# Patient Record
Sex: Female | Born: 1980 | Race: White | Hispanic: No | Marital: Single | Smoking: Current every day smoker
Health system: Southern US, Community
[De-identification: ages and names within clinical notes are randomized; demographics above are authoritative.]

## PROBLEM LIST (undated history)

## (undated) DIAGNOSIS — K759 Inflammatory liver disease, unspecified: Secondary | ICD-10-CM

## (undated) DIAGNOSIS — F1990 Other psychoactive substance use, unspecified, uncomplicated: Secondary | ICD-10-CM

## (undated) DIAGNOSIS — F329 Major depressive disorder, single episode, unspecified: Secondary | ICD-10-CM

## (undated) DIAGNOSIS — F1910 Other psychoactive substance abuse, uncomplicated: Secondary | ICD-10-CM

## (undated) DIAGNOSIS — F419 Anxiety disorder, unspecified: Secondary | ICD-10-CM

## (undated) DIAGNOSIS — F32A Depression, unspecified: Secondary | ICD-10-CM

## (undated) HISTORY — PX: TONSILLECTOMY: SUR1361

## (undated) HISTORY — PX: BACK SURGERY: SHX140

---

## 2013-06-21 ENCOUNTER — Emergency Department (HOSPITAL_COMMUNITY)
Admission: EM | Admit: 2013-06-21 | Discharge: 2013-06-22 | Disposition: A | Discharge status: Home / Self Care | Payer: Self-pay | Attending: Emergency Medicine | Admitting: Emergency Medicine

## 2013-06-21 ENCOUNTER — Encounter (HOSPITAL_COMMUNITY): Payer: Self-pay | Admitting: Emergency Medicine

## 2013-06-21 DIAGNOSIS — F1310 Sedative, hypnotic or anxiolytic abuse, uncomplicated: Secondary | ICD-10-CM | POA: Insufficient documentation

## 2013-06-21 DIAGNOSIS — F1910 Other psychoactive substance abuse, uncomplicated: Secondary | ICD-10-CM

## 2013-06-21 DIAGNOSIS — F1110 Opioid abuse, uncomplicated: Secondary | ICD-10-CM | POA: Insufficient documentation

## 2013-06-21 DIAGNOSIS — R45851 Suicidal ideations: Secondary | ICD-10-CM | POA: Insufficient documentation

## 2013-06-21 DIAGNOSIS — F3289 Other specified depressive episodes: Secondary | ICD-10-CM | POA: Insufficient documentation

## 2013-06-21 DIAGNOSIS — F1410 Cocaine abuse, uncomplicated: Secondary | ICD-10-CM | POA: Insufficient documentation

## 2013-06-21 DIAGNOSIS — F329 Major depressive disorder, single episode, unspecified: Secondary | ICD-10-CM | POA: Insufficient documentation

## 2013-06-21 DIAGNOSIS — Z3202 Encounter for pregnancy test, result negative: Secondary | ICD-10-CM | POA: Insufficient documentation

## 2013-06-21 LAB — CBC WITH DIFFERENTIAL/PLATELET
Basophils Relative: 1 % (ref 0–1)
Eosinophils Absolute: 0.3 10*3/uL (ref 0.0–0.7)
Eosinophils Relative: 3 % (ref 0–5)
HCT: 44 % (ref 36.0–46.0)
Lymphocytes Relative: 40 % (ref 12–46)
Lymphs Abs: 3.6 10*3/uL (ref 0.7–4.0)
MCH: 35.8 pg — ABNORMAL HIGH (ref 26.0–34.0)
MCHC: 36.1 g/dL — ABNORMAL HIGH (ref 30.0–36.0)
MCV: 99.1 fL (ref 78.0–100.0)
Monocytes Absolute: 0.7 10*3/uL (ref 0.1–1.0)
Monocytes Relative: 8 % (ref 3–12)
Neutrophils Relative %: 48 % (ref 43–77)
Platelets: 209 10*3/uL (ref 150–400)
WBC: 8.9 10*3/uL (ref 4.0–10.5)

## 2013-06-21 LAB — BASIC METABOLIC PANEL
BUN: 11 mg/dL (ref 6–23)
CO2: 28 mEq/L (ref 19–32)
Calcium: 9.5 mg/dL (ref 8.4–10.5)
Chloride: 99 mEq/L (ref 96–112)
GFR calc Af Amer: >90 mL/min (ref >90)
GFR calc non Af Amer: >90 mL/min (ref >90)
Glucose, Bld: 109 mg/dL — ABNORMAL HIGH (ref 70–99)
Potassium: 3.8 mEq/L (ref 3.5–5.1)
Sodium: 136 mEq/L (ref 135–145)

## 2013-06-21 LAB — ETHANOL: Alcohol, Ethyl (B): <11 mg/dL (ref 0–11)

## 2013-06-21 LAB — RAPID URINE DRUG SCREEN, HOSP PERFORMED
Barbiturates: POSITIVE — AB
Benzodiazepines: NOT DETECTED
Tetrahydrocannabinol: NOT DETECTED

## 2013-06-21 MED ORDER — NAPROXEN 500 MG PO TABS: 500.0000 mg | ORAL_TABLET | Freq: Two times a day (BID) | ORAL | Status: DC | PRN

## 2013-06-21 MED ORDER — METHOCARBAMOL 500 MG PO TABS: 500.0000 mg | ORAL_TABLET | Freq: Three times a day (TID) | ORAL | Status: DC | PRN

## 2013-06-21 MED ORDER — ONDANSETRON 4 MG PO TBDP: 4.0000 mg | ORAL_TABLET | Freq: Four times a day (QID) | ORAL | Status: DC | PRN

## 2013-06-21 MED ORDER — CLONIDINE HCL 0.1 MG PO TABS: 0.1000 mg | ORAL_TABLET | Freq: Every day | ORAL | Status: DC

## 2013-06-21 MED ORDER — HYDROXYZINE HCL 25 MG PO TABS
25.0000 mg | ORAL_TABLET | Freq: Four times a day (QID) | ORAL | Status: DC | PRN
  Administered 2013-06-22: 25 mg via ORAL
  Filled 2013-06-21: qty 1

## 2013-06-21 MED ORDER — LOPERAMIDE HCL 2 MG PO CAPS: 2.0000 mg | ORAL_CAPSULE | ORAL | Status: DC | PRN

## 2013-06-21 MED ORDER — CLONIDINE HCL 0.1 MG PO TABS: 0.1000 mg | ORAL_TABLET | ORAL | Status: DC

## 2013-06-21 MED ORDER — CLONIDINE HCL 0.1 MG PO TABS
0.1000 mg | ORAL_TABLET | Freq: Four times a day (QID) | ORAL | Status: DC
  Administered 2013-06-21 – 2013-06-22 (×4): 0.1 mg via ORAL
  Filled 2013-06-21 (×4): qty 1

## 2013-06-21 MED ORDER — DICYCLOMINE HCL 20 MG PO TABS: 20.0000 mg | ORAL_TABLET | Freq: Four times a day (QID) | ORAL | Status: DC | PRN

## 2013-06-21 NOTE — ED Notes (Addendum)
Pt has been wanded, belongings have been wanded and are at nurses station in triage. Attempted to call report. Nurses in report right now and are not available. Unable to give urine sample at this time as she states it is hard for her when she has been using heroin.

## 2013-06-21 NOTE — BH Assessment (Signed)
[facility] Assessment Progress Note    06/21/13.  Pt accepted to [facility].  Authorization obtained from [facility].  Auth # [id number] for 3 days from [place].  [facility] notified and they request that they be called with arrival time for pt, particularly if pt is taking the train. Daleen Squibb, LCSW

## 2013-06-21 NOTE — BH Assessment (Signed)
Assessment Note  Janice Simmons is an 32 y.o. female. Pt states she "wants to get off all the drugs I'm on."  Pt reports she is currently using heroin, morphine, opana, and cocaine.  Pt also reports she recently broke up with her girlfriend and has been depressed.  Pt is using heroin and morphine daily with last use this afternoon.  Pt denies current withdrawals but reports that she does experience significant withdrawals but they have not started currently.  Pt reports history of sweats, body aches, GI upset.  Pt reports that she has been depressed for several months and that recently, including earlier today, she has been having thoughts of "not wanting to be here anymore."  When questioned about this, pt has not had any suicidal plan or intent.  Pt knows that her substance abuse issues are the major issue in her life and pt does report she can contract for safety at this time.  Pt denies HI/AV.  Axis I: opioide dependence Axis II: Deferred Axis III: History reviewed. No pertinent past medical history. Axis IV: problems with primary support group Axis V: 41-50 serious symptoms  Past Medical History: History reviewed. No pertinent past medical history.  Past Surgical History  Procedure Laterality Date  . Tonsillectomy      Family History: No family history on file.  Social History:  reports that she uses illicit drugs (Cocaine and IV). She reports that she does not drink alcohol. Her tobacco history is not on file.  Additional Social History:  Alcohol / Drug Use Pain Medications: yes Prescriptions: yes History of alcohol / drug use?: Yes Negative Consequences of Use: Financial;Personal relationships;Work / Mining engineer #1 Name of Substance 1: heroin 1 - Age of First Use: 25 1 - Amount (size/oz): 1.5 grams 1 - Frequency: daily 1 - Duration: 3 years 1 - Last Use / Amount: 9/28 1 gram Substance #2 Name of Substance 2: morphine 2 - Age of First Use: 25 2 - Amount (size/oz):  350-400mg  2 - Frequency: daily 2 - Duration: 3 months 2 - Last Use / Amount: 9/28 120mg  Substance #3 Name of Substance 3: opana 3 - Age of First Use: 29 3 - Amount (size/oz): 40mg  3 - Frequency: 1-2x week 3 - Duration: 1 year 3 - Last Use / Amount: 9/24 40mg  Substance #4 Name of Substance 4: cocaine 4 - Age of First Use: 18 4 - Amount (size/oz): $20-40 4 - Frequency: 1x week 4 - Duration: 4 months 4 - Last Use / Amount: 9/23 $20  CIWA: CIWA-Ar BP: 103/64 mmHg Pulse Rate: 85 COWS: Clinical Opiate Withdrawal Scale (COWS) Resting Pulse Rate: Pulse Rate 81-100 Sweating: No report of chills or flushing Restlessness: Able to sit still Pupil Size: Pupils pinned or normal size for room light Bone or Joint Aches: Not present Runny Nose or Tearing: Not present GI Upset: No GI symptoms Tremor: No tremor Yawning: No yawning Anxiety or Irritability: None Gooseflesh Skin: Skin is smooth COWS Total Score: 1  Allergies: No Known Allergies  Home Medications:  (Not in a hospital admission)  OB/GYN Status:  Patient's last menstrual period was 05/29/2013.  General Assessment Data Location of Assessment: [facility] ACT Assessment: Yes Is this a Tele or Face-to-Face Assessment?: Face-to-Face Is this an Initial Assessment or a Re-assessment for this encounter?: Initial Assessment Living Arrangements: Parent Can pt return to current living arrangement?: Yes Admission Status: Voluntary Is patient capable of signing voluntary admission?: Yes Transfer from: Acute Hospital Referral Source: Self/Family/Friend  [facility] Crisis Care Plan Living Arrangements: Parent Name of Psychiatrist: none Name of Therapist: none     Risk to self Suicidal Ideation: Yes-Currently Present Suicidal Intent: No Is patient at risk for suicide?: No Suicidal Plan?: No Access to Means: No What has been your use of drugs/alcohol within the last 12 months?: current significant use Previous Attempts/Gestures:  No Intentional Self Injurious Behavior: None Family Suicide History: No Recent stressful life event(s): Conflict (Comment) (breakup with female partner) Persecutory voices/beliefs?: No Depression: Yes Depression Symptoms: Despondent;Insomnia;Tearfulness;Isolating;Fatigue;Guilt;Loss of interest in usual pleasures;Feeling worthless/self pity;Feeling angry/irritable Substance abuse history and/or treatment for substance abuse?: Yes Suicide prevention information given to non-admitted patients: Not applicable  Risk to Others Homicidal Ideation: No Thoughts of Harm to Others: No Current Homicidal Intent: No Current Homicidal Plan: No Access to Homicidal Means: No History of harm to others?: No Assessment of Violence: None Noted Does patient have access to weapons?: No Criminal Charges Pending?: No Does patient have a court date: No  Psychosis Hallucinations: None noted Delusions: None noted  Mental Status Report Appear/Hygiene: Other (Comment) (casual) Eye Contact: Good Motor Activity: Unremarkable Speech: Logical/coherent Level of Consciousness: Alert Mood: Depressed Affect: Appropriate to circumstance Anxiety Level: Minimal Thought Processes: Coherent;Relevant Judgement: Unimpaired Orientation: Person;Place;Time;Situation Obsessive Compulsive Thoughts/Behaviors: None  Cognitive Functioning Concentration: Normal Memory: Recent Intact;Remote Intact IQ: Average Insight: Good Impulse Control: Poor Appetite: Poor Weight Loss: 0 Weight Gain: 0 Sleep: Decreased Total Hours of Sleep: 3 Vegetative Symptoms: None  ADLScreening [facility] Assessment Services) Patient's cognitive ability adequate to safely complete daily activities?: No Patient able to express need for assistance with ADLs?: Yes Independently performs ADLs?: Yes (appropriate for developmental age)  Prior Inpatient Therapy Prior Inpatient Therapy: Yes Prior Therapy Dates: 2011 Prior Therapy Facilty/Provider(s):  [facility] Reason for Treatment: detox  Prior Outpatient Therapy Prior Outpatient Therapy: No  ADL Screening (condition at time of admission) Patient's cognitive ability adequate to safely complete daily activities?: No Patient able to express need for assistance with ADLs?: Yes Independently performs ADLs?: Yes (appropriate for developmental age)       Abuse/Neglect Assessment (Assessment to be complete while patient is alone) Physical Abuse: Denies Verbal Abuse: Denies Sexual Abuse: Denies Exploitation of patient/patient's resources: Denies Self-Neglect: Denies Values / Beliefs Cultural Requests During Hospitalization: None Spiritual Requests During Hospitalization: None   Advance Directives (For Healthcare) Advance Directive: Patient does not have advance directive;Patient would not like information    Additional Information 1:1 In Past 12 Months?: No CIRT Risk: No Elopement Risk: No Does patient have medical clearance?: Yes     Disposition:  Disposition Initial Assessment Completed for this Encounter: Yes  On Site Evaluation by:   Reviewed with Physician:    Lorri Frederick 06/21/2013 8:25 PM

## 2013-06-21 NOTE — ED Notes (Signed)
Pt states that she wants detox from opana,  Heroine, cocaine, and morphine. States she is depressed and suicidal w/o a plan. Has not been to detox before.

## 2013-06-21 NOTE — ED Provider Notes (Addendum)
CSN: [id number]     Arrival date & time 06/21/13  1722 History   First MD Initiated Contact with Patient 06/21/13 1734     Chief Complaint  Patient presents with  . Medical Clearance   (Consider location/radiation/quality/duration/timing/severity/associated sxs/prior Treatment) HPI Comments: Injects heroin, oxycodone, morphine, opana.  Last used this am.  Also uses cocaine but last use was earlier this week.  Patient is a 32 y.o. female presenting with drug problem. The history is provided by the patient.  Drug Problem This is a chronic problem. Episode onset: 3 years. The problem occurs constantly. The problem has not changed since onset.Associated symptoms comments: Depressed for the last few months and for the last few days pt had felt hopeless, that life is not worth living and passive SI. Nothing aggravates the symptoms. Nothing relieves the symptoms. She has tried nothing (has tried to quit at home by herself but did not succeed) for the symptoms. The treatment provided no relief.    History reviewed. No pertinent past medical history. Past Surgical History  Procedure Laterality Date  . Tonsillectomy     No family history on file. History  Substance Use Topics  . Smoking status: Not on file  . Smokeless tobacco: Not on file  . Alcohol Use: No   OB History   Grav Para Term Preterm Abortions TAB SAB Ect Mult Living                 Review of Systems  Psychiatric/Behavioral: Positive for suicidal ideas.  All other systems reviewed and are negative.    Allergies  Review of patient's allergies indicates no known allergies.  Home Medications  No current outpatient prescriptions on file. BP 119/65  Pulse 90  Temp(Src) 98.3 F (36.8 C) (Oral)  Resp 20  SpO2 98%  LMP 05/29/2013 Physical Exam  Nursing note and vitals reviewed. Constitutional: She is oriented to person, place, and time. She appears well-developed and well-nourished. No distress.  HENT:  Head:  Normocephalic and atraumatic.  Mouth/Throat: Oropharynx is clear and moist.  Eyes: Conjunctivae and EOM are normal. Pupils are equal, round, and reactive to light.  Neck: Normal range of motion. Neck supple.  Cardiovascular: Normal rate, regular rhythm and intact distal pulses.   No murmur heard. Pulmonary/Chest: Effort normal and breath sounds normal. No respiratory distress. She has no wheezes. She has no rales.  Abdominal: Soft. She exhibits no distension. There is no tenderness. There is no rebound and no guarding.  Musculoskeletal: Normal range of motion. She exhibits no edema and no tenderness.  Track marks but no signs of erythema or infection  Neurological: She is alert and oriented to person, place, and time.  Skin: Skin is warm and dry. No rash noted. No erythema.  Psychiatric: Her behavior is normal. She exhibits a depressed mood. She expresses suicidal ideation. She expresses no suicidal plans.    ED Course  Procedures (including critical care time) Labs Review Labs Reviewed - No data to display Imaging Review No results found.  MDM   1. Polysubstance abuse   2. Depression     Patient presenting with a history of narcotic and cocaine abuse here requesting detox but states that she has also had worsening depression and for the last 3-4 days started having past suicidal ideation. She has never attempted detox before and has tried just not using at home over the last few days and has failed because the withdrawal symptoms are too much for her to bear.  Patient has no prior medical history and has never been treated for depression. She has never attempted suicide in the suicidal ideations or new. Normal medical screening exam. Med clearance labs sent and patient placed on clonidine detox precautions. [facility] notified.    Gwyneth Sprout, MD 06/21/13 1754  Gwyneth Sprout, MD 06/21/13 1755

## 2013-06-22 DIAGNOSIS — F1910 Other psychoactive substance abuse, uncomplicated: Secondary | ICD-10-CM

## 2013-06-22 NOTE — Progress Notes (Signed)
Patient ID: Janice Simmons, female   DOB: 03-18-1981, 32 y.o.   MRN: [id number] ADDENDUM NOTE:  Patient decided she must leave, [facility] does not have any bed.  Patient is stable, states she does not think Clonidine is helping her.  She has not asked for medications for cramping or diarrhea.  Offered admission but she declined and wanted to leave.  Patient states her mother got her a bed at [facility]. Dr Lolly Mustache was consulted who says to offer admission to our inpatient unit but she declined.   Patient declined admission again and was discharged. Janice Simmons   PMHNP-BC I agreed with the findings, treatment and disposition plan of this patient.  Patient does not exhibit any suicidal thoughts or homicidal thoughts.  Offer outpatient referrals and inpatient psychiatric treatment but she declined. Kathryne Sharper, MD

## 2013-06-22 NOTE — BH Assessment (Signed)
Education officer, environmental for [employer]. To inquire re: getting pt placed there.   Evette Cristal, [place] Assessment Counselor

## 2013-06-22 NOTE — BH Assessment (Signed)
Per Alcario Drought at [facility], they have no [place] beds today.  Evette Cristal, [place] Assessment Counselor

## 2013-06-22 NOTE — ED Provider Notes (Signed)
Patient cleared for discharge by psychiatry. Patient has been given her instructions and followup resources by behavioral health.  Gilda Crease, MD 06/22/13 [phone]

## 2013-06-22 NOTE — Consult Note (Signed)
[facility] Face-to-Face Psychiatry Consult   Reason for Consult:  Opioid Dependence Referring Physician:  EDP Janice Simmons is an 32 y.o. female.  Assessment: AXIS I:  Substance Abuse AXIS II:  Deferred AXIS III:  History reviewed. No pertinent past medical history. AXIS IV:  other psychosocial or environmental problems and problems related to social environment AXIS V:  51-60 moderate symptoms  Plan:  Recommend psychiatric Inpatient admission when medically cleared.  Subjective:   Janice Simmons is a 32 y.o. female patient admitted with Opioid dependence.  HPI:  Patient is seeking detox from opioids; Heroin, Opana, Morphine and coacaine.   Patient also states she is depressed with suicidal thoughts but has no plan.  Patient reports using Morphine and Heroin daily since breaking up with her girl friend.  Patient denies previous detox treatment and stated she has had issues with drugs for a long time.  When asked why now, why she is seeking treatment she says because "my using is getting out of control"  We will seek a referral to out patient rehabilitation facility.  Meanwhile we will continue to use our clonidine protocol for her detoxification.  She denies SI/HI/AVH.  She is c/o body cramping and is medicated accordingly with Robaxin.  HPI Elements:   Location:  [facility]. Quality:  SEVERE.  Past Psychiatric History: History reviewed. No pertinent past medical history.  reports that she uses illicit drugs (Cocaine and IV). She reports that she does not drink alcohol. Her tobacco history is not on file. No family history on file. Family History Substance Abuse: Yes, Describe: (father) Family Supports: Yes, List: (girlfriend, family) Living Arrangements: Parent Can pt return to current living arrangement?: Yes Abuse/Neglect [facility]) Physical Abuse: Denies Verbal Abuse: Denies Sexual Abuse: Denies Allergies:  No Known Allergies  ACT Assessment Complete:  Yes:    Educational Status    Risk  to Self: Risk to self Suicidal Ideation: Yes-Currently Present Suicidal Intent: No Is patient at risk for suicide?: No Suicidal Plan?: No Access to Means: No What has been your use of drugs/alcohol within the last 12 months?: current significant use Previous Attempts/Gestures: No Intentional Self Injurious Behavior: None Family Suicide History: No Recent stressful life event(s): Conflict (Comment) (breakup with female partner) Persecutory voices/beliefs?: No Depression: Yes Depression Symptoms: Despondent;Insomnia;Tearfulness;Isolating;Fatigue;Guilt;Loss of interest in usual pleasures;Feeling worthless/self pity;Feeling angry/irritable Substance abuse history and/or treatment for substance abuse?: Yes Suicide prevention information given to non-admitted patients: Not applicable  Risk to Others: Risk to Others Homicidal Ideation: No Thoughts of Harm to Others: No Current Homicidal Intent: No Current Homicidal Plan: No Access to Homicidal Means: No History of harm to others?: No Assessment of Violence: None Noted Does patient have access to weapons?: No Criminal Charges Pending?: No Does patient have a court date: No  Abuse: Abuse/Neglect Assessment (Assessment to be complete while patient is alone) Physical Abuse: Denies Verbal Abuse: Denies Sexual Abuse: Denies Exploitation of patient/patient's resources: Denies Self-Neglect: Denies  Prior Inpatient Therapy: Prior Inpatient Therapy Prior Inpatient Therapy: Yes Prior Therapy Dates: 2011 Prior Therapy Facilty/Provider(s): [facility] Reason for Treatment: detox  Prior Outpatient Therapy: Prior Outpatient Therapy Prior Outpatient Therapy: No  Additional Information: Additional Information 1:1 In Past 12 Months?: No CIRT Risk: No Elopement Risk: No Does patient have medical clearance?: Yes                  Objective: Blood pressure 123/78, pulse 53, temperature 98.3 F (36.8 C), temperature source Oral,  resp. rate 18, last menstrual period 05/29/2013,  SpO2 95.00%.There is no height or weight on file to calculate BMI. Results for orders placed during the hospital encounter of 06/21/13 (from the past 72 hour(s))  CBC WITH DIFFERENTIAL     Status: Abnormal   Collection Time    06/21/13  7:21 PM      Result Value Range   WBC 8.9  4.0 - 10.5 K/uL   RBC 4.44  3.87 - 5.11 MIL/uL   Hemoglobin 15.9 (*) 12.0 - 15.0 g/dL   HCT 16.1  09.6 - 04.5 %   MCV 99.1  78.0 - 100.0 fL   MCH 35.8 (*) 26.0 - 34.0 pg   MCHC 36.1 (*) 30.0 - 36.0 g/dL   RDW 40.9  81.1 - 91.4 %   Platelets 209  150 - 400 K/uL   Neutrophils Relative % 48  43 - 77 %   Neutro Abs 4.3  1.7 - 7.7 K/uL   Lymphocytes Relative 40  12 - 46 %   Lymphs Abs 3.6  0.7 - 4.0 K/uL   Monocytes Relative 8  3 - 12 %   Monocytes Absolute 0.7  0.1 - 1.0 K/uL   Eosinophils Relative 3  0 - 5 %   Eosinophils Absolute 0.3  0.0 - 0.7 K/uL   Basophils Relative 1  0 - 1 %   Basophils Absolute 0.1  0.0 - 0.1 K/uL  BASIC METABOLIC PANEL     Status: Abnormal   Collection Time    06/21/13  7:21 PM      Result Value Range   Sodium 136  135 - 145 mEq/L   Potassium 3.8  3.5 - 5.1 mEq/L   Chloride 99  96 - 112 mEq/L   CO2 28  19 - 32 mEq/L   Glucose, Bld 109 (*) 70 - 99 mg/dL   BUN 11  6 - 23 mg/dL   Creatinine, Ser 7.82  0.50 - 1.10 mg/dL   Calcium 9.5  8.4 - 95.6 mg/dL   GFR calc non Af Amer >90  >90 mL/min   GFR calc Af Amer >90  >90 mL/min   Comment: (NOTE)     The eGFR has been calculated using the CKD EPI equation.     This calculation has not been validated in all clinical situations.     eGFR's persistently <90 mL/min signify possible Chronic Kidney     Disease.  ETHANOL     Status: None   Collection Time    06/21/13  7:21 PM      Result Value Range   Alcohol, Ethyl (B) <11  0 - 11 mg/dL   Comment:            LOWEST DETECTABLE LIMIT FOR     SERUM ALCOHOL IS 11 mg/dL     FOR MEDICAL PURPOSES ONLY  URINE RAPID DRUG SCREEN (HOSP  PERFORMED)     Status: Abnormal   Collection Time    06/21/13  8:11 PM      Result Value Range   Opiates POSITIVE (*) NONE DETECTED   Cocaine NONE DETECTED  NONE DETECTED   Benzodiazepines NONE DETECTED  NONE DETECTED   Amphetamines NONE DETECTED  NONE DETECTED   Tetrahydrocannabinol NONE DETECTED  NONE DETECTED   Barbiturates POSITIVE (*) NONE DETECTED   Comment:            DRUG SCREEN FOR MEDICAL PURPOSES     ONLY.  IF CONFIRMATION IS NEEDED     FOR ANY PURPOSE, NOTIFY  LAB     WITHIN 5 DAYS.                LOWEST DETECTABLE LIMITS     FOR URINE DRUG SCREEN     Drug Class       Cutoff (ng/mL)     Amphetamine      1000     Barbiturate      200     Benzodiazepine   200     Tricyclics       300     Opiates          300     Cocaine          300     THC              50  POCT PREGNANCY, URINE     Status: None   Collection Time    06/21/13  8:21 PM      Result Value Range   Preg Test, Ur NEGATIVE  NEGATIVE   Comment:            THE SENSITIVITY OF THIS     METHODOLOGY IS >24 mIU/mL   Labs are reviewed and are pertinent for POSITIVE for Opiates and Barbiturates, elevated MCH and MCV.  Current Facility-Administered Medications  Medication Dose Route Frequency Provider Last Rate Last Dose  . cloNIDine (CATAPRES) tablet 0.1 mg  0.1 mg Oral QID Gwyneth Sprout, MD   0.1 mg at 06/22/13 0946   Followed by  . [START ON 06/24/2013] cloNIDine (CATAPRES) tablet 0.1 mg  0.1 mg Oral BH-qamhs Gwyneth Sprout, MD       Followed by  . [START ON 06/26/2013] cloNIDine (CATAPRES) tablet 0.1 mg  0.1 mg Oral QAC breakfast Gwyneth Sprout, MD      . dicyclomine (BENTYL) tablet 20 mg  20 mg Oral Q6H PRN Gwyneth Sprout, MD      . hydrOXYzine (ATARAX/VISTARIL) tablet 25 mg  25 mg Oral Q6H PRN Gwyneth Sprout, MD   25 mg at 06/22/13 0033  . loperamide (IMODIUM) capsule 2-4 mg  2-4 mg Oral PRN Gwyneth Sprout, MD      . methocarbamol (ROBAXIN) tablet 500 mg  500 mg Oral Q8H PRN Gwyneth Sprout, MD       . naproxen (NAPROSYN) tablet 500 mg  500 mg Oral BID PRN Gwyneth Sprout, MD      . ondansetron (ZOFRAN-ODT) disintegrating tablet 4 mg  4 mg Oral Q6H PRN Gwyneth Sprout, MD       No current outpatient prescriptions on file.    Psychiatric Specialty Exam:     Blood pressure 123/78, pulse 53, temperature 98.3 F (36.8 C), temperature source Oral, resp. rate 18, last menstrual period 05/29/2013, SpO2 95.00%.There is no height or weight on file to calculate BMI.  General Appearance: Casual  Eye Contact::  Fair  Speech:  Clear and Coherent and Normal Rate  Volume:  Normal  Mood:  Anxious, Depressed and Irritable  Affect:  Congruent  Thought Process:  Coherent  Orientation:  Full (Time, Place, and Person)  Thought Content:  NA  Suicidal Thoughts:  No  Homicidal Thoughts:  No  Memory:  Immediate;   Good Recent;   Good Remote;   Good  Judgement:  Poor  Insight:  Fair  Psychomotor Activity:  Restlessness  Concentration:  Fair  Recall:  NA  Akathisia:  NA  Handed:  Right  AIMS (if indicated):     Assets:  Desire for  Improvement  Sleep:      Treatment Plan Summary:  Consult and face to face interview with Arfeen We will continue to use our Clonidine protocol for her detox We will seek referral to another facility that has available bed for detox.  Daily contact with patient to assess and evaluate symptoms and progress in treatment Medication management  Earney Navy  PMHNP-BC 06/22/2013 12:06 PM  I have personally seen the patient and agreed with the findings and involved in the treatment plan. Kathryne Sharper, MD

## 2013-06-22 NOTE — Progress Notes (Signed)
[facility] provided patient with a [facility] Orange Card application, highlighting [facility].  °

## 2013-06-22 NOTE — Progress Notes (Signed)
Patient requesting to go home, CSW provided pt with outpatient resources. CSW informed rn and EDP.   Marland KitchenFrutoso Schatz [phone]  ED CSW 06/22/2013 1502pm

## 2013-06-22 NOTE — Progress Notes (Signed)
CSW met with pt regarding transportation. Patient states that she does not have a ride to [facility], and does not have a valid ID. Due to patient not having a valid ID, patient can not be transported to [facility] by train. CSW called [facility] to refer patient for detox, however will have to call bed around 9:15 to discuss admissions once availability is determined.   Catha Gosselin, LCSW [phone]  ED CSW .06/22/2013 8:21am

## 2013-11-12 ENCOUNTER — Emergency Department (HOSPITAL_COMMUNITY)
Admission: EM | Admit: 2013-11-12 | Discharge: 2013-11-12 | Disposition: A | Discharge status: Other Acute Inpatient Hospital | Payer: Self-pay | Attending: Emergency Medicine | Admitting: Emergency Medicine

## 2013-11-12 ENCOUNTER — Inpatient Hospital Stay (HOSPITAL_COMMUNITY)
Admission: AD | Admit: 2013-11-12 | Discharge: 2013-11-13 | DRG: 897 | Disposition: A | Discharge status: Home / Self Care | Payer: Federal, State, Local not specified - Other | Source: Intra-hospital | Attending: Psychiatry | Admitting: Psychiatry

## 2013-11-12 ENCOUNTER — Encounter (HOSPITAL_COMMUNITY): Payer: Self-pay | Admitting: Emergency Medicine

## 2013-11-12 ENCOUNTER — Encounter (HOSPITAL_COMMUNITY): Payer: Self-pay | Admitting: *Deleted

## 2013-11-12 DIAGNOSIS — F1910 Other psychoactive substance abuse, uncomplicated: Secondary | ICD-10-CM

## 2013-11-12 DIAGNOSIS — F1920 Other psychoactive substance dependence, uncomplicated: Principal | ICD-10-CM | POA: Diagnosis present

## 2013-11-12 DIAGNOSIS — F1410 Cocaine abuse, uncomplicated: Secondary | ICD-10-CM | POA: Diagnosis present

## 2013-11-12 DIAGNOSIS — Z8619 Personal history of other infectious and parasitic diseases: Secondary | ICD-10-CM | POA: Insufficient documentation

## 2013-11-12 DIAGNOSIS — F3289 Other specified depressive episodes: Secondary | ICD-10-CM | POA: Insufficient documentation

## 2013-11-12 DIAGNOSIS — F329 Major depressive disorder, single episode, unspecified: Secondary | ICD-10-CM | POA: Insufficient documentation

## 2013-11-12 DIAGNOSIS — F1020 Alcohol dependence, uncomplicated: Secondary | ICD-10-CM | POA: Insufficient documentation

## 2013-11-12 DIAGNOSIS — F1110 Opioid abuse, uncomplicated: Secondary | ICD-10-CM | POA: Insufficient documentation

## 2013-11-12 DIAGNOSIS — F1510 Other stimulant abuse, uncomplicated: Secondary | ICD-10-CM | POA: Insufficient documentation

## 2013-11-12 DIAGNOSIS — R300 Dysuria: Secondary | ICD-10-CM | POA: Insufficient documentation

## 2013-11-12 DIAGNOSIS — F17200 Nicotine dependence, unspecified, uncomplicated: Secondary | ICD-10-CM | POA: Insufficient documentation

## 2013-11-12 DIAGNOSIS — G479 Sleep disorder, unspecified: Secondary | ICD-10-CM | POA: Insufficient documentation

## 2013-11-12 DIAGNOSIS — F411 Generalized anxiety disorder: Secondary | ICD-10-CM | POA: Insufficient documentation

## 2013-11-12 HISTORY — DX: Major depressive disorder, single episode, unspecified: F32.9

## 2013-11-12 HISTORY — DX: Depression, unspecified: F32.A

## 2013-11-12 HISTORY — DX: Anxiety disorder, unspecified: F41.9

## 2013-11-12 LAB — COMPREHENSIVE METABOLIC PANEL
ALBUMIN: 3.4 g/dL — AB (ref 3.5–5.2)
ALT: 21 U/L (ref 0–35)
AST: 20 U/L (ref 0–37)
Alkaline Phosphatase: 120 U/L — ABNORMAL HIGH (ref 39–117)
BUN: 10 mg/dL (ref 6–23)
CHLORIDE: 101 meq/L (ref 96–112)
CO2: 29 mEq/L (ref 19–32)
CREATININE: 0.63 mg/dL (ref 0.50–1.10)
Calcium: 9.1 mg/dL (ref 8.4–10.5)
GFR calc Af Amer: >90 mL/min (ref >90)
GFR calc non Af Amer: >90 mL/min (ref >90)
Glucose, Bld: 103 mg/dL — ABNORMAL HIGH (ref 70–99)
Potassium: 3.9 mEq/L (ref 3.7–5.3)
Sodium: 142 mEq/L (ref 137–147)
Total Bilirubin: 0.7 mg/dL (ref 0.3–1.2)
Total Protein: 7.4 g/dL (ref 6.0–8.3)

## 2013-11-12 LAB — URINALYSIS, ROUTINE W REFLEX MICROSCOPIC
BILIRUBIN URINE: NEGATIVE
Glucose, UA: NEGATIVE mg/dL
Hgb urine dipstick: NEGATIVE
KETONES UR: NEGATIVE mg/dL
NITRITE: NEGATIVE
PH: 6 (ref 5.0–8.0)
Protein, ur: NEGATIVE mg/dL
Specific Gravity, Urine: 1.016 (ref 1.005–1.030)
Urobilinogen, UA: 0.2 mg/dL (ref 0.0–1.0)

## 2013-11-12 LAB — RAPID URINE DRUG SCREEN, HOSP PERFORMED
Amphetamines: NOT DETECTED
BARBITURATES: NOT DETECTED
BENZODIAZEPINES: POSITIVE — AB
Cocaine: NOT DETECTED
Opiates: POSITIVE — AB
Tetrahydrocannabinol: NOT DETECTED

## 2013-11-12 LAB — CBC WITH DIFFERENTIAL/PLATELET
BASOS ABS: 0 10*3/uL (ref 0.0–0.1)
BASOS PCT: 0 % (ref 0–1)
EOS PCT: 5 % (ref 0–5)
Eosinophils Absolute: 0.4 10*3/uL (ref 0.0–0.7)
HCT: 48.1 % — ABNORMAL HIGH (ref 36.0–46.0)
Hemoglobin: 16.5 g/dL — ABNORMAL HIGH (ref 12.0–15.0)
Lymphocytes Relative: 44 % (ref 12–46)
Lymphs Abs: 3.7 10*3/uL (ref 0.7–4.0)
MCH: 31.6 pg (ref 26.0–34.0)
MCHC: 34.3 g/dL (ref 30.0–36.0)
MCV: 92.1 fL (ref 78.0–100.0)
Monocytes Absolute: 0.6 10*3/uL (ref 0.1–1.0)
Monocytes Relative: 8 % (ref 3–12)
Neutro Abs: 3.6 10*3/uL (ref 1.7–7.7)
Neutrophils Relative %: 43 % (ref 43–77)
PLATELETS: 219 10*3/uL (ref 150–400)
RBC: 5.22 MIL/uL — ABNORMAL HIGH (ref 3.87–5.11)
RDW: 14.1 % (ref 11.5–15.5)
WBC: 8.3 10*3/uL (ref 4.0–10.5)

## 2013-11-12 LAB — URINE MICROSCOPIC-ADD ON

## 2013-11-12 LAB — SALICYLATE LEVEL

## 2013-11-12 LAB — ACETAMINOPHEN LEVEL

## 2013-11-12 LAB — ETHANOL: Alcohol, Ethyl (B): <11 mg/dL (ref 0–11)

## 2013-11-12 MED ORDER — ALUM & MAG HYDROXIDE-SIMETH 200-200-20 MG/5ML PO SUSP
30.0000 mL | ORAL | Status: DC | PRN
Start: 2013-11-12 — End: 2013-11-14

## 2013-11-12 MED ORDER — ONDANSETRON HCL 4 MG PO TABS: 4.0000 mg | ORAL_TABLET | Freq: Three times a day (TID) | ORAL | Status: DC | PRN

## 2013-11-12 MED ORDER — VITAMIN B-1 100 MG PO TABS
100.0000 mg | ORAL_TABLET | Freq: Every day | ORAL | Status: DC
  Administered 2013-11-13: 100 mg via ORAL
  Filled 2013-11-12 (×3): qty 1

## 2013-11-12 MED ORDER — ALUM & MAG HYDROXIDE-SIMETH 200-200-20 MG/5ML PO SUSP
30.0000 mL | ORAL | Status: DC | PRN
Start: 2013-11-12 — End: 2013-11-12

## 2013-11-12 MED ORDER — LORAZEPAM 1 MG PO TABS
1.0000 mg | ORAL_TABLET | Freq: Once | ORAL | Status: AC
  Administered 2013-11-12: 1 mg via ORAL
  Filled 2013-11-12: qty 1

## 2013-11-12 MED ORDER — ACETAMINOPHEN 325 MG PO TABS: 650.0000 mg | ORAL_TABLET | Freq: Four times a day (QID) | ORAL | Status: DC | PRN

## 2013-11-12 MED ORDER — CHLORDIAZEPOXIDE HCL 25 MG PO CAPS: 50.0000 mg | ORAL_CAPSULE | Freq: Every day | ORAL | Status: DC

## 2013-11-12 MED ORDER — CHLORDIAZEPOXIDE HCL 25 MG PO CAPS: 25.0000 mg | ORAL_CAPSULE | Freq: Every day | ORAL | Status: DC

## 2013-11-12 MED ORDER — SODIUM CHLORIDE 0.9 % IV BOLUS (SEPSIS)
1000.0000 mL | Freq: Once | INTRAVENOUS | Status: AC
  Administered 2013-11-12: 1000 mL via INTRAVENOUS

## 2013-11-12 MED ORDER — ZOLPIDEM TARTRATE 5 MG PO TABS: 5.0000 mg | ORAL_TABLET | Freq: Every evening | ORAL | Status: DC | PRN

## 2013-11-12 MED ORDER — CHLORDIAZEPOXIDE HCL 25 MG PO CAPS: 25.0000 mg | ORAL_CAPSULE | Freq: Three times a day (TID) | ORAL | Status: DC

## 2013-11-12 MED ORDER — CHLORDIAZEPOXIDE HCL 25 MG PO CAPS
25.0000 mg | ORAL_CAPSULE | Freq: Four times a day (QID) | ORAL | Status: DC
  Filled 2013-11-12: qty 1

## 2013-11-12 MED ORDER — ONDANSETRON 4 MG PO TBDP
4.0000 mg | ORAL_TABLET | Freq: Once | ORAL | Status: AC
  Administered 2013-11-12: 4 mg via ORAL
  Filled 2013-11-12: qty 1

## 2013-11-12 MED ORDER — CHLORDIAZEPOXIDE HCL 25 MG PO CAPS: 25.0000 mg | ORAL_CAPSULE | Freq: Four times a day (QID) | ORAL | Status: DC

## 2013-11-12 MED ORDER — IBUPROFEN 200 MG PO TABS
600.0000 mg | ORAL_TABLET | Freq: Once | ORAL | Status: AC
  Administered 2013-11-12: 600 mg via ORAL
  Filled 2013-11-12: qty 3

## 2013-11-12 MED ORDER — CHLORDIAZEPOXIDE HCL 25 MG PO CAPS
25.0000 mg | ORAL_CAPSULE | Freq: Four times a day (QID) | ORAL | Status: DC
  Administered 2013-11-12 – 2013-11-13 (×3): 25 mg via ORAL

## 2013-11-12 MED ORDER — HYDROXYZINE HCL 25 MG PO TABS: 25.0000 mg | ORAL_TABLET | Freq: Four times a day (QID) | ORAL | Status: DC | PRN

## 2013-11-12 MED ORDER — CHLORDIAZEPOXIDE HCL 25 MG PO CAPS
50.0000 mg | ORAL_CAPSULE | Freq: Once | ORAL | Status: AC
  Administered 2013-11-12: 50 mg via ORAL
  Filled 2013-11-12: qty 2

## 2013-11-12 MED ORDER — TRAZODONE HCL 50 MG PO TABS
50.0000 mg | ORAL_TABLET | Freq: Every day | ORAL | Status: DC
  Administered 2013-11-12: 50 mg via ORAL
  Filled 2013-11-12 (×4): qty 1

## 2013-11-12 MED ORDER — CHLORDIAZEPOXIDE HCL 25 MG PO CAPS: 25.0000 mg | ORAL_CAPSULE | ORAL | Status: DC

## 2013-11-12 MED ORDER — MAGNESIUM HYDROXIDE 400 MG/5ML PO SUSP: 30.0000 mL | Freq: Every day | ORAL | Status: DC | PRN

## 2013-11-12 MED ORDER — NAPROXEN 500 MG PO TABS: 500.0000 mg | ORAL_TABLET | Freq: Two times a day (BID) | ORAL | Status: DC | PRN

## 2013-11-12 MED ORDER — CLONIDINE HCL 0.1 MG PO TABS: 0.1000 mg | ORAL_TABLET | Freq: Every day | ORAL | Status: DC

## 2013-11-12 MED ORDER — NICOTINE 21 MG/24HR TD PT24
21.0000 mg | MEDICATED_PATCH | Freq: Every day | TRANSDERMAL | Status: DC
  Filled 2013-11-12 (×3): qty 1

## 2013-11-12 MED ORDER — DICYCLOMINE HCL 20 MG PO TABS
20.0000 mg | ORAL_TABLET | Freq: Four times a day (QID) | ORAL | Status: DC | PRN
  Administered 2013-11-12: 20 mg via ORAL
  Filled 2013-11-12: qty 1

## 2013-11-12 MED ORDER — ADULT MULTIVITAMIN W/MINERALS CH
1.0000 | ORAL_TABLET | Freq: Every day | ORAL | Status: DC
  Administered 2013-11-13: 1 via ORAL
  Filled 2013-11-12 (×5): qty 1

## 2013-11-12 MED ORDER — CLONIDINE HCL 0.1 MG PO TABS
0.1000 mg | ORAL_TABLET | Freq: Four times a day (QID) | ORAL | Status: DC
  Administered 2013-11-12 – 2013-11-13 (×3): 0.1 mg via ORAL
  Filled 2013-11-12 (×11): qty 1

## 2013-11-12 MED ORDER — CLONIDINE HCL 0.1 MG PO TABS
0.1000 mg | ORAL_TABLET | ORAL | Status: DC
  Filled 2013-11-12: qty 1

## 2013-11-12 MED ORDER — METHOCARBAMOL 500 MG PO TABS
500.0000 mg | ORAL_TABLET | Freq: Three times a day (TID) | ORAL | Status: DC | PRN
  Administered 2013-11-12: 500 mg via ORAL
  Filled 2013-11-12: qty 1

## 2013-11-12 MED ORDER — LOPERAMIDE HCL 2 MG PO CAPS: 2.0000 mg | ORAL_CAPSULE | ORAL | Status: DC | PRN

## 2013-11-12 MED ORDER — THIAMINE HCL 100 MG/ML IJ SOLN: 100.0000 mg | Freq: Once | INTRAMUSCULAR | Status: DC

## 2013-11-12 MED ORDER — ACETAMINOPHEN 325 MG PO TABS: 650.0000 mg | ORAL_TABLET | ORAL | Status: DC | PRN

## 2013-11-12 MED ORDER — IBUPROFEN 400 MG PO TABS: 600.0000 mg | ORAL_TABLET | Freq: Three times a day (TID) | ORAL | Status: DC | PRN

## 2013-11-12 MED ORDER — CHLORDIAZEPOXIDE HCL 25 MG PO CAPS
25.0000 mg | ORAL_CAPSULE | Freq: Once | ORAL | Status: DC
  Filled 2013-11-12: qty 1

## 2013-11-12 MED ORDER — CHLORDIAZEPOXIDE HCL 25 MG PO CAPS
25.0000 mg | ORAL_CAPSULE | Freq: Four times a day (QID) | ORAL | Status: DC | PRN
  Filled 2013-11-12: qty 1

## 2013-11-12 MED ORDER — ONDANSETRON 4 MG PO TBDP
4.0000 mg | ORAL_TABLET | Freq: Four times a day (QID) | ORAL | Status: DC | PRN
Start: 2013-11-12 — End: 2013-11-14
  Administered 2013-11-12 – 2013-11-13 (×2): 4 mg via ORAL
  Filled 2013-11-12 (×2): qty 1

## 2013-11-12 MED ORDER — NICOTINE 21 MG/24HR TD PT24
21.0000 mg | MEDICATED_PATCH | Freq: Every day | TRANSDERMAL | Status: DC
  Administered 2013-11-12: 21 mg via TRANSDERMAL
  Filled 2013-11-12: qty 1

## 2013-11-12 NOTE — ED Provider Notes (Signed)
CSN: [id number]     Arrival date & time 11/12/13  0715 History   First MD Initiated Contact with Patient 11/12/13 [phone]     Chief Complaint  Patient presents with  . Medical Clearance     (Consider location/radiation/quality/duration/timing/severity/associated sxs/prior Treatment) HPI 33 yo female presents with c/o polysubstance abuse. Patient wants help with detox. Patient states she has been using Heroine, Opiods, Alcohol, Methamphetamines x 7 years. Patient reports last alcohol consumption 2 days ago. Patient reports alcohol consumption x 5 days a week x 2 years. Patient reports drinking "a case" at a time and sometimes "some liquour".  Patient states last heroin use was at 3 oclock this am and morphine about 5 am today. Patient admits to withdrawal symptoms from alcohol about 2 years ago. Patient admits to anxiety and depression. Patient denies any SI or HI. Denies auditory/ visual hallucinations. Patient denies any allergies to any medications. Denies any PMH. Patient admits to 24 pack year hx of smoking.   Patient admits to hx of Hepatitis C secondary to IV drug use. Patient states she has known about it for 2 years but does not see anyone currently regarding this.  Past Medical History  Diagnosis Date  . Depression   . Anxiety    Past Surgical History  Procedure Laterality Date  . Tonsillectomy     History reviewed. No pertinent family history. History  Substance Use Topics  . Smoking status: Current Every Day Smoker -- 2.00 packs/day    Types: Cigarettes  . Smokeless tobacco: Not on file  . Alcohol Use: Yes     Comment: pt reports binge drinking    OB History   Grav Para Term Preterm Abortions TAB SAB Ect Mult Living                 Review of Systems  Genitourinary: Positive for difficulty urinating ("when i take a lot of opiates").  Psychiatric/Behavioral: Positive for sleep disturbance (Admits to difficulty sleeping. "1 hour per night if I'm lucky"). Negative for  suicidal ideas. The patient is nervous/anxious.   All other systems reviewed and are negative.      Allergies  Review of patient's allergies indicates no known allergies.  Home Medications  No current outpatient prescriptions on file. BP 110/70  Pulse 80  Temp(Src) 97.5 F (36.4 C) (Oral)  Resp 16  SpO2 96%  LMP 11/06/2013 Physical Exam  Nursing note and vitals reviewed. Constitutional: She is oriented to person, place, and time. She appears well-developed and well-nourished. No distress.  HENT:  Head: Normocephalic and atraumatic.  Eyes: Conjunctivae are normal. Pupils are equal, round, and reactive to light. No scleral icterus.  Neck: No JVD present. No tracheal deviation present.  Cardiovascular: Normal rate and regular rhythm.  Exam reveals no gallop and no friction rub.   No murmur heard. Pulmonary/Chest: Effort normal and breath sounds normal. No respiratory distress. She has no wheezes. She has no rhonchi. She has no rales.  Abdominal: Soft. Bowel sounds are normal. She exhibits no distension. There is no hepatosplenomegaly. There is no tenderness. There is no rigidity, no rebound, no guarding, no tenderness at McBurney's point and negative Murphy's sign.  Musculoskeletal: Normal range of motion. She exhibits no edema.  Neurological: She is alert and oriented to person, place, and time. She has normal strength. No cranial nerve deficit or sensory deficit.  Reflex Scores:      Bicep reflexes are 2+ on the right side and 2+ on the left side.  Achilles reflexes are 2+ on the right side and 2+ on the left side. Skin: Skin is warm and dry. She is not diaphoretic.  Psychiatric: She has a normal mood and affect. Her behavior is normal.    ED Course  Procedures (including critical care time) Labs Review Labs Reviewed  CBC WITH DIFFERENTIAL - Abnormal; Notable for the following:    RBC 5.22 (*)    Hemoglobin 16.5 (*)    HCT 48.1 (*)    All other components within  normal limits  URINALYSIS, ROUTINE W REFLEX MICROSCOPIC - Abnormal; Notable for the following:    APPearance CLOUDY (*)    Leukocytes, UA SMALL (*)    All other components within normal limits  URINE RAPID DRUG SCREEN (HOSP PERFORMED) - Abnormal; Notable for the following:    Opiates POSITIVE (*)    Benzodiazepines POSITIVE (*)    All other components within normal limits  URINE MICROSCOPIC-ADD ON - Abnormal; Notable for the following:    Squamous Epithelial / LPF FEW (*)    Bacteria, UA FEW (*)    All other components within normal limits  COMPREHENSIVE METABOLIC PANEL - Abnormal; Notable for the following:    Glucose, Bld 103 (*)    Albumin 3.4 (*)    Alkaline Phosphatase 120 (*)    All other components within normal limits  SALICYLATE LEVEL - Abnormal; Notable for the following:    Salicylate Lvl <2.0 (*)    All other components within normal limits  ETHANOL  ACETAMINOPHEN LEVEL   Imaging Review No results found.  EKG Interpretation   None       MDM   Final diagnoses:  Polysubstance abuse  Alcoholism  History of hepatitis C   UDS positive for Opiates and BZDs.  Elevated H&H consistent with dehydration. IV fluids started.  Tylenol / Salicylate levels negative.  Ethanol negative   Patient medically cleared by my exam. CIWA protocol initiated in ED. Patient currently awaiting TTS evaluation.   Meds given in ED:  Medications  acetaminophen (TYLENOL) tablet 650 mg (not administered)  ibuprofen (ADVIL,MOTRIN) tablet 600 mg (not administered)  zolpidem (AMBIEN) tablet 5 mg (not administered)  nicotine (NICODERM CQ - dosed in mg/24 hours) patch 21 mg (21 mg Transdermal Patch Applied 11/12/13 1258)  ondansetron (ZOFRAN) tablet 4 mg (not administered)  alum & mag hydroxide-simeth (MAALOX/MYLANTA) 200-200-20 MG/5ML suspension 30 mL (not administered)  ondansetron (ZOFRAN-ODT) disintegrating tablet 4 mg (4 mg Oral Given 11/12/13 0940)  ibuprofen (ADVIL,MOTRIN) tablet 600  mg (600 mg Oral Given 11/12/13 0940)  LORazepam (ATIVAN) tablet 1 mg (1 mg Oral Given 11/12/13 0940)  sodium chloride 0.9 % bolus 1,000 mL (0 mLs Intravenous Stopped 11/12/13 1300)    New Prescriptions   No medications on file         Rudene Anda, PA-C 11/12/13 1314

## 2013-11-12 NOTE — ED Notes (Signed)
RN attempted blood x2

## 2013-11-12 NOTE — ED Notes (Addendum)
Pt is requesting detox from heroin, opana, dilaudid, morphine, xanax, and alcohol. Last use of heroin 0.5gram at 0400 and morphine 100mg  at 0330. Pt states that last alcohol was 2 days ago. Pt states that she will binge drink and then not drink for several days. Pt states that she is "starting to feel like crap". Pt reports having tremors, diarrhea, sweats, vomiting, cramping when she has went through detox in the past. Last detox was 8-9 months ago at [facility]. Denies SI/HI.

## 2013-11-12 NOTE — Accreditation Note (Signed)
[facility] by Renata Capriceonrad, NP to Dr. Geoffery LyonsIrving Lugo. The room # is 307-1. Pt's nurse updated with patient's disposition and will complete support paperwork.

## 2013-11-12 NOTE — BH Assessment (Signed)
Tele Assessment Note   Janice Simmons is an 33 y.o. female that [facility].  Pt is requesting detox from heroin, opana, dilaudid, morphine, xanax, and alcohol. See Additional History Section below for details of patient's substance use. Pt states that she will binge drink and then not drink for several days. She reports consistent use of all other drugs. Pt stated to ED staff  that she "starting to feel like crap". Pt reports having tremors, diarrhea, sweats, vomiting, cramping when she has went through detox in the past. Last detox was 8-9 months ago at [facility]. She has also received [facility]. She [facility]. Denies SI/HI. She admits to symptoms of depression and anxiety. She has no outpatient mental health providers. She reports having a current legal charge for misdemeanor Larceny.     Axis I: Polysubstance Abuse; Depressive Disorder Nos; Anxiety Disorder Nos Axis II: Deferred Axis III:  Past Medical History  Diagnosis Date  . Depression   . Anxiety    Axis IV: other psychosocial or environmental problems, problems related to legal system/crime, problems related to social environment, problems with access to health care services and problems with primary support group Axis V: 31-40 impairment in reality testing  Past Medical History:  Past Medical History  Diagnosis Date  . Depression   . Anxiety     Past Surgical History  Procedure Laterality Date  . Tonsillectomy      Family History: History reviewed. No pertinent family history.  Social History:  reports that she has been smoking Cigarettes.  She has been smoking about 2.00 packs per day. She does not have any smokeless tobacco history on file. She reports that she drinks alcohol. She reports that she uses illicit drugs (Cocaine, IV, Benzodiazepines, Heroin, and Morphine).  Additional Social History:  Alcohol / Drug Use Pain Medications:  SEE MAR Prescriptions: SEE MAR Over the Counter: SEE MAR History of alcohol / drug use?: No history of alcohol / drug abuse Substance #1 Name of Substance 1: Benzodiazepine-Xanax 1 - Age of First Use: 33 yrs old  1 - Amount (size/oz): 5-6 pills  1 - Frequency: daily  1 - Duration: on-going for the past 5 yrs  1 - Last Use / Amount: 11/11/2013 is patient's last use; pt reports using 5-6 Benzo's/Xanax  Substance #2 Name of Substance 2: Alcohol  2 - Age of First Use: 33 yrs old  2 - Amount (size/oz): 1 case per use  2 - Frequency: 2-3x's per week  2 - Duration: on-going  2 - Last Use / Amount: 2 days ago Substance #3 Name of Substance 3: Opiates: Opana, Dilaudid, and Morphine 3 - Age of First Use: 33 yrs old  3 - Amount (size/oz): varies: 2-4 pills per day 3 - Frequency: daily  3 - Duration: on-going  3 - Last Use / Amount: 5 am this morning 11/12/2013; pt reports taking 1 morphine pill  Substance #4 Name of Substance 4: Heroin  4 - Age of First Use: 33 yrs old  4 - Amount (size/oz): 1-2 grams of Heroin  4 - Frequency: daily  4 - Duration: daily for the past 5 yrs  4 - Last Use / Amount: 3am this morning 11/12/2013; pt IV use 1- 2 grams of Heroin   CIWA: CIWA-Ar BP: 110/70 mmHg Pulse Rate: 80 Nausea and Vomiting: 2 Tactile Disturbances: none Tremor: no tremor Auditory Disturbances: not present Paroxysmal Sweats: no sweat visible  Visual Disturbances: not present Anxiety: mildly anxious Headache, Fullness in Head: mild Agitation: somewhat more than normal activity Orientation and Clouding of Sensorium: oriented and can do serial additions CIWA-Ar Total: 6 COWS: Clinical Opiate Withdrawal Scale (COWS) Resting Pulse Rate: Pulse Rate 80 or below Sweating: Subjective report of chills or flushing Restlessness: Able to sit still Pupil Size: Pupils pinned or normal size for room light Bone or Joint Aches: Mild diffuse discomfort Runny Nose or Tearing: Not present GI Upset:  nausea or loose stool Tremor: No tremor Yawning: No yawning Anxiety or Irritability: Patient reports increasing irritability or anxiousness Gooseflesh Skin: Skin is smooth COWS Total Score: 5  Allergies: No Known Allergies  Home Medications:  (Not in a hospital admission)  OB/GYN Status:  Patient's last menstrual period was 11/06/2013.  General Assessment Data Location of Assessment: [facility] ED Is this a Tele or Face-to-Face Assessment?: Tele Assessment Is this an Initial Assessment or a Re-assessment for this encounter?: Initial Assessment Living Arrangements: Non-relatives/Friends;Other (Comment);Other relatives (lives with family and friends ) Can pt return to current living arrangement?: Yes Admission Status: Voluntary Is patient capable of signing voluntary admission?: Yes Transfer from: Acute Hospital Referral Source: Self/Family/Friend     [facility] Crisis Care Plan Living Arrangements: Non-relatives/Friends;Other (Comment);Other relatives (lives with family and friends ) Name of Psychiatrist:  (No psychiatrist ) Name of Therapist:  (No therapist )  Education Status Is patient currently in school?: No  Risk to self Suicidal Ideation: No Suicidal Intent: No Is patient at risk for suicide?: No Suicidal Plan?: No Access to Means: No What has been your use of drugs/alcohol within the last 12 months?:  (Heroin, Opana, Dilaudid, Morphine, Xanax, & Alcohol Use ) Previous Attempts/Gestures: No How many times?:  (0) Other Self Harm Risks:  (n/a) Triggers for Past Attempts: Other (Comment) (pt denies previous attempts or gestures ) Intentional Self Injurious Behavior: None Family Suicide History: No Recent stressful life event(s): Other (Comment) (drug use) Persecutory voices/beliefs?: No Depression: Yes Depression Symptoms: Feeling angry/irritable;Loss of interest in usual pleasures;Fatigue;Isolating;Tearfulness Substance abuse history and/or treatment for substance abuse?:  Yes Suicide prevention information given to non-admitted patients: Not applicable  Risk to Others Homicidal Ideation: No Thoughts of Harm to Others: No Current Homicidal Intent: No Current Homicidal Plan: No Access to Homicidal Means: No Identified Victim:  (n/a) History of harm to others?: No Assessment of Violence: None Noted Violent Behavior Description:  (patient is calm and cooperative ) Does patient have access to weapons?: No Criminal Charges Pending?: No Does patient have a court date: No  Psychosis Hallucinations: None noted Delusions: None noted  Mental Status Report Appear/Hygiene: Disheveled Eye Contact: Poor Motor Activity: Freedom of movement Speech: Logical/coherent Level of Consciousness: Alert;Restless Mood: Depressed;Irritable Affect: Appropriate to circumstance Anxiety Level: None Thought Processes: Coherent;Relevant Judgement: Unimpaired Orientation: Person;Place;Time;Situation Obsessive Compulsive Thoughts/Behaviors: None  Cognitive Functioning Concentration: Decreased Memory: Remote Intact;Recent Intact IQ: Average Insight: Fair Impulse Control: Fair Appetite: Poor Weight Loss:  (pt sts that her wt. fluctuates ) Weight Gain:  (Pt sts that her wt. fluctuates ) Sleep: Decreased Total Hours of Sleep:  (varies ) Vegetative Symptoms: None  ADLScreening [facility] Assessment Services) Patient's cognitive ability adequate to safely complete daily activities?: Yes Patient able to express need for assistance with ADLs?: Yes Independently performs ADLs?: Yes (appropriate for developmental age)  Prior Inpatient Therapy Prior Inpatient Therapy: Yes Prior Therapy Dates:  (pt unable to recall ) Prior Therapy Facilty/Provider(s):  ([facility], [facility] [place], and [facility] ) Reason  for Treatment:  (substance abuse/depression )  Prior Outpatient Therapy Prior Outpatient Therapy: No Prior Therapy Dates:  (n/a) Prior Therapy Facilty/Provider(s):   (n/a) Reason for Treatment:  (n/a)  ADL Screening (condition at time of admission) Patient's cognitive ability adequate to safely complete daily activities?: Yes Is the patient deaf or have difficulty hearing?: No Does the patient have difficulty seeing, even when wearing glasses/contacts?: Yes Does the patient have difficulty concentrating, remembering, or making decisions?: No Patient able to express need for assistance with ADLs?: Yes Does the patient have difficulty dressing or bathing?: No Independently performs ADLs?: Yes (appropriate for developmental age) Does the patient have difficulty walking or climbing stairs?: No Weakness of Legs: None Weakness of Arms/Hands: None  Home Assistive Devices/Equipment Home Assistive Devices/Equipment: None    Abuse/Neglect Assessment (Assessment to be complete while patient is alone) Physical Abuse: Denies Verbal Abuse: Denies Sexual Abuse: Denies Exploitation of patient/patient's resources: Denies Self-Neglect: Denies Values / Beliefs Cultural Requests During Hospitalization: None Spiritual Requests During Hospitalization: None   Advance Directives (For Healthcare) Advance Directive: Patient does not have advance directive Nutrition Screen- [facility] Adult/[facility]/AP Patient's home diet: Regular  Additional Information 1:1 In Past 12 Months?: No CIRT Risk: No Elopement Risk: No Does patient have medical clearance?: Yes     Disposition:  Disposition Initial Assessment Completed for this Encounter: Yes Disposition of Patient: Inpatient treatment program Type of inpatient treatment program: Adult (Pt accepted to [facility] )  Melynda Ripple [facility] 11/12/2013 1:27 PM

## 2013-11-12 NOTE — Progress Notes (Signed)
Recreation Therapy Notes  Date: 02.19.2015 Time: 2:45pm Location: [address] Dayroom   Group Topic: Communication, Team Building, Problem Solving  Goal Area(s) Addresses:  Patient will effectively work with peer towards shared goal.  Patient will identify skill used to make activity successful.  Patient will identify how skills used during activity can be used to reach post d/c goals.   Behavioral Response: Did not attend.   Marykay Lexenise L Disaya Walt, LRT/CTRS  Jearl KlinefelterBlanchfield, Shakeila Pfarr L 11/12/2013 8:54 PM

## 2013-11-12 NOTE — Progress Notes (Signed)
Psychoeducational Group Note  Date:  11/12/2013 Time:  2100 Group Topic/Focus:  wrap up group  Participation Level: Did Not Attend  Participation Quality:  Not Applicable  Affect:  Not Applicable  Cognitive:  Not Applicable  Insight:  Not Applicable  Engagement in Group: Not Applicable  Additional Comments:  Pt remained in bed during group time.   Shelah LewandowskySquires, Gemma Ruan Carol 11/12/2013, 10:47 PM

## 2013-11-12 NOTE — ED Notes (Signed)
PA at bedside.

## 2013-11-12 NOTE — Progress Notes (Signed)
Pt. Is a 33 year old caucasian female presenting for detox from heroin/dilaudid/morphine/xanax/alcohol.  Pt. Has been using for 7 years, states she last used 0.5gms of heroin this morning ~0400 and morphine 100mg  ~0330.  Pt.'s last detox attempt was 8-9 months ago after which she relapsed immediately.  Pt. Denies any HI/SI/AVH.  She questioned the use of suboxone, stating that was what she wanted.  Pt. Informed that would not be an option here for her, patient expressed understanding.

## 2013-11-12 NOTE — ED Notes (Signed)
[facility] Arrived. Danielle, RN gave report to Cicero DuckErika, RN at 13:40

## 2013-11-12 NOTE — ED Provider Notes (Signed)
Patient complaining of a showing signs of polysubstance use, abuse, and withdrawal. He is desirous of inpatient "help". Has been accepted inpatient behavioral health hospital.  Janice PorterMark Jabaree Mercado, MD 11/12/13 1252

## 2013-11-12 NOTE — ED Notes (Signed)
Meal recieved

## 2013-11-12 NOTE — ED Notes (Signed)
Pt in scrubs.

## 2013-11-13 ENCOUNTER — Encounter (HOSPITAL_COMMUNITY): Payer: Self-pay | Admitting: Psychiatry

## 2013-11-13 DIAGNOSIS — F1920 Other psychoactive substance dependence, uncomplicated: Principal | ICD-10-CM

## 2013-11-13 NOTE — Progress Notes (Signed)
Recreation Therapy Notes  Date: 02.20.2015 Time: 2:45pm Location: 500 [address] Dayroom   Group Topic: Communication, Team Building, Problem Solving  Goal Area(s) Addresses:  Patient will effectively work with peer towards shared goal.  Patient will identify skill used to make activity successful.  Patient will identify how skills used during activity can be used to reach post d/c goals.   Behavioral Response: Did not attend.   Jariyah Hackley L Dimitris Shanahan, LRT/CTRS  Rayaan Garguilo L 11/13/2013 4:10 PM 

## 2013-11-13 NOTE — [facility] Suicide Risk Assessment (Signed)
Suicide Risk Assessment  Discharge Assessment     Demographic Factors:  Caucasian  Total Time spent with patient: 1 hour  Psychiatric Specialty Exam:     Blood pressure 112/68, pulse 84, temperature 97.7 F (36.5 C), temperature source Oral, resp. rate 18, height 6' (1.829 m), weight 98.884 kg (218 lb), last menstrual period 11/06/2013, SpO2 97.00%.Body mass index is 29.56 kg/(m^2).  General Appearance: Disheveled  Eye SolicitorContact::  Fair  Speech:  Clear and Coherent  Volume:  Normal  Mood:  Anxious  Affect:  anxious  Thought Process:  Coherent and Goal Directed  Orientation:  Full (Time, Place, and Person)  Thought Content:  symptoms, wanting to be D/C to go and get into a Suboxone Clinic   Suicidal Thoughts:  No  Homicidal Thoughts:  No  Memory:  Immediate;   Fair Recent;   Fair Remote;   Fair  Judgement:  Fair  Insight:  Shallow  Psychomotor Activity:  Restlessness  Concentration:  Fair  Recall:  Fis[employer]Fair  Fund of Knowledge:Fair  Language: Fair  Akathisia:  No  Handed:    AIMS (if indicated):     Assets:  Desire for Improvement Housing Social Support  Sleep:  Number of Hours: 4.5    Musculoskeletal: Strength & Muscle Tone: within normal limits Gait & Station: normal Patient leans: N/A   Mental Status Per Nursing Assessment::   On Admission:     Current Mental Status by Physician: In full contact with reality. There are no suicidal ideas, plans or intent. Wants to be D/C. Wants to go back to a Suboxone clinic. Does not plan to get off Benzodiazepines   Loss Factors: NA  Historical Factors: NA  Risk Reduction Factors:   Sense of responsibility to family and Positive social support  Continued Clinical Symptoms:  Alcohol/Substance Abuse/Dependencies  Cognitive Features That Contribute To Risk:  Closed-mindedness Polarized thinking Thought constriction (tunnel vision)    Suicide Risk:  Minimal: No identifiable suicidal ideation.  Patients presenting  with no risk factors but with morbid ruminations; may be classified as minimal risk based on the severity of the depressive symptoms  Discharge Diagnoses:   AXIS I:  Polysubstance Dependence Including Opioids, Mood Disorder NOS, Substance Induced Mood Disorder AXIS II:  No diagnosis AXIS III:   Past Medical History  Diagnosis Date  . Depression   . Anxiety    AXIS IV:  other psychosocial or environmental problems AXIS V:  61-70 mild symptoms  Plan Of Care/Follow-up recommendations:  Activity:  as tolerated Diet:  regular Follow up with Suboxone Clinic  Is patient on multiple antipsychotic therapies at discharge:  No   Has Patient had three or more failed trials of antipsychotic monotherapy by history:  No  Recommended Plan for Multiple Antipsychotic Therapies: NA    Padme Arriaga A 11/13/2013, 3:04 PM

## 2013-11-13 NOTE — [facility] Group Notes (Signed)
[facility] LCSW Group Therapy  11/13/2013 1:15 PM   Type of Therapy:  Group Therapy  Participation Level:  Did Not Attend - pt refusing groups and meeting with providers about d/c  Reyes IvanChelsea Horton, LCSW 11/13/2013 2:47 PM

## 2013-11-13 NOTE — [facility] Suicide Risk Assessment (Signed)
[facility] INPATIENT:  Family/Significant Other Suicide Prevention Education  Suicide Prevention Education:  Patient Refusal for Family/Significant Other Suicide Prevention Education: The patient Janice Simmons has refused to provide written consent for family/significant other to be provided Family/Significant Other Suicide Prevention Education during admission and/or prior to discharge.  Physician notified.  Patient refused on admission and while inpatient when asked a second time. Reports she has a boyfriend who is supportive, but not willing to let LCSW contact.  No family involvement per report.  Raye SorrowCoble, Louie Flenner N 11/13/2013, 1:32 PM

## 2013-11-13 NOTE — Progress Notes (Signed)
LCSW has tried several times today to meet with patient to complete PSA.   Patient reports she wants to be left alone and does not want to be bothered.  PSA not completed today due to patient refusal. Will have weekend attempt.  Ashley JacobsHannah Nail, MSW, LCSW Clinical Lead [phone]

## 2013-11-13 NOTE — [facility] Group Notes (Signed)
[facility]   11/13/2013  8:45 AM  Participation Quality:  Did Not Attend - pt was sleeping in her room  Reyes IvanChelsea Horton, LCSW 11/13/2013 9:45 AM

## 2013-11-13 NOTE — Progress Notes (Signed)
Patient ID: Janice Simmons, female   DOB: 1981-05-22, 33 y.o.   MRN: [id number] D: Patient reports increased anxiety and withdrawal symptoms. Pt denies suicidal /homicidal ideation intent and plan. Pt denies auditory and visual hallucination. Pt denies any needs or concerns. Cooperative with assessment. No acute distressed noted at this time.   A: Met with pt 1:1. Medications administered as prescribed. Writer encouraged pt to discuss feelings. Pt encouraged to come to staff with any questions or concerns.   R: Patient is safe on the unit. He is complaint with medications and denies any adverse reaction. Continue current POC.

## 2013-11-13 NOTE — Progress Notes (Signed)
[facility] Discharge [facility] :  Will you be returning to the same living situation after discharge: Yes,  home to previous dwelling At discharge, do you have transportation home?:Yes,  boyfriend will come and pick her up Do you have the ability to pay for your medications:Yes,  no barriers  Release of information consent forms completed and in the chart;  Patient's signature needed at discharge.  Patient to Follow up at: Follow-up Information   Follow up with Patient refused. . (Patient refused all resources)       Patient denies SI/HI:   Yes,  patient reports no SI    Safety Planning and Suicide Prevention discussed:  No. Patient refused all services and resources. Patient reports she has information for Suboxene and will make the call when she is ready. DC home with boyfriend, with whom she has called to come and pick her up. Raye SorrowCoble, Clemons Salvucci N 11/13/2013, 1:41 PM

## 2013-11-13 NOTE — H&P (Signed)
Psychiatric Admission Assessment Adult  Patient Identification:  Janice Simmons Date of Evaluation:  11/13/2013 Chief Complaint:  [facility]ZO DEPENDENCY ETOH ABUSE OPIATE DEPENDENCY History of Present Illness:: 33 Y/O female who states that she is having a hard time with detox. She has been using Heroin 2 grams a day (IV) as well as Xanax. States she takes up to 10 of the Xanax 1 mg daily. Has been in several detox, residential treatment centers. She was last in [place]. 8 months ago. Was on Subxone 3 months ago,. States she got with the wrong people and went back to Heroin. States she has to be on maintenance Suboxone or Methadone or she relapses. She also reports she drinks, and uses cocaine. She is living with a boyfriend who supports her use. She does not work (has not work "ever") states when she is not "high" she is depressed or angry. Elements:  Location:  addiction to opioids,alcohol, cocaine, benzodiazepines, with underlying mood disorder. Quality:  uses every day, when not high having mood problems. Severity:  severe. Timing:  every day. Duration:  since she got off the Suboxone several months ago. Context:  polysubstance Dependence including opioids with underying mood disorde. Associated Signs/Synptoms: Depression Symptoms:  depressed mood, anhedonia, insomnia, fatigue, difficulty concentrating, anxiety, panic attacks, insomnia, loss of energy/fatigue, disturbed sleep, weight loss, decreased appetite, (Hypo) Manic Symptoms:  Impulsivity, Irritable Mood, Labiality of Mood, Anxiety Symptoms:  Excessive Worry, Panic Symptoms, Psychotic Symptoms:  Denies PTSD Symptoms: Negative Total Time spent with patient: 45 minutes  Psychiatric Specialty Exam: Physical Exam  Review of Systems  Constitutional: Positive for malaise/fatigue.  Eyes: Negative.   Respiratory: Negative.   Cardiovascular: Negative.   Gastrointestinal: Positive for nausea.  Genitourinary: Negative.    Musculoskeletal: Positive for back pain, joint pain and myalgias.  Skin: Negative.   Neurological: Positive for weakness and headaches.  Endo/Heme/Allergies: Negative.   Psychiatric/Behavioral: Positive for depression and substance abuse. The patient is nervous/anxious and has insomnia.     Blood pressure 127/79, pulse 60, temperature 97.7 F (36.5 C), temperature source Oral, resp. rate 16, height 6' (1.829 m), weight 98.884 kg (218 lb), last menstrual period 11/06/2013, SpO2 97.00%.Body mass index is 29.56 kg/(m^2).  General Appearance: Disheveled  Eye Sport and exercise psychologist::  Fair  Speech:  Clear and Coherent  Volume:  Decreased  Mood:  Anxious and "withdrawing"  Affect:  anxious, worried  Thought Process:  Coherent and Goal Directed  Orientation:  Full (Time, Place, and Person)  Thought Content:  symptoms, wanting to be back on Suboxone maintenance  Suicidal Thoughts:  No  Homicidal Thoughts:  No  Memory:  Immediate;   Fair Recent;   Fair Remote;   Fair  Judgement:  Fair  Insight:  Shallow  Psychomotor Activity:  Restlessness  Concentration:  Fair  Recall:  [employer] of Knowledge:NA  Language: Fair  Akathisia:  No  Handed:    AIMS (if indicated):     Assets:  Housing Social Support  Sleep:  Number of Hours: 4.5    Musculoskeletal: Strength & Muscle Tone: within normal limits Gait & Station: normal Patient leans: N/A  Past Psychiatric History: Diagnosis:  Hospitalizations:[facility], [place], [place]  Outpatient Care: Not currently  Substance Abuse Care: [place], [place]  Self-Mutilation: Denies  Suicidal Attempts: Denies  Violent Behaviors: Denies   Past Medical History:   Past Medical History  Diagnosis Date  . Depression   . Anxiety     Allergies:  No Known Allergies PTA Medications:  No prescriptions prior to admission    Previous Psychotropic Medications:  Medication/Dose   Xanax (from the street)               Substance  Abuse History in the last 12 months:  yes  Consequences of Substance Abuse: Withdrawal Symptoms:   Cramps Diarrhea Headaches Nausea Tremors Vomiting  Social History:  reports that she has been smoking Cigarettes.  She has been smoking about 2.00 packs per day. She does not have any smokeless tobacco history on file. She reports that she drinks alcohol. She reports that she uses illicit drugs (Cocaine, IV, Benzodiazepines, Heroin, and Morphine). Additional Social History:                      Current Place of Residence:  Lives with boyfriend Place of Birth:   Family Members: Marital Status:  Single Children:  Sons:  Daughters: 8 Relationships: Education:  HS Soil scientist Problems/Performance: Religious Beliefs/Practices: History of Abuse (Emotional/Phsycial/Sexual) Ship broker History:  None. Legal History: Hobbies/Interests:  Family History:  History reviewed. No pertinent family history.  Results for orders placed during the hospital encounter of 11/12/13 (from the past 72 hour(s))  URINALYSIS, ROUTINE W REFLEX MICROSCOPIC     Status: Abnormal   Collection Time    11/12/13  8:06 AM      Result Value Ref Range   Color, Urine YELLOW  YELLOW   APPearance CLOUDY (*) CLEAR   Specific Gravity, Urine 1.016  1.005 - 1.030   pH 6.0  5.0 - 8.0   Glucose, UA NEGATIVE  NEGATIVE mg/dL   Hgb urine dipstick NEGATIVE  NEGATIVE   Bilirubin Urine NEGATIVE  NEGATIVE   Ketones, ur NEGATIVE  NEGATIVE mg/dL   Protein, ur NEGATIVE  NEGATIVE mg/dL   Urobilinogen, UA 0.2  0.0 - 1.0 mg/dL   Nitrite NEGATIVE  NEGATIVE   Leukocytes, UA SMALL (*) NEGATIVE  URINE RAPID DRUG SCREEN (HOSP PERFORMED)     Status: Abnormal   Collection Time    11/12/13  8:06 AM      Result Value Ref Range   Opiates POSITIVE (*) NONE DETECTED   Cocaine NONE DETECTED  NONE DETECTED   Benzodiazepines POSITIVE (*) NONE DETECTED   Amphetamines NONE DETECTED  NONE DETECTED    Tetrahydrocannabinol NONE DETECTED  NONE DETECTED   Barbiturates NONE DETECTED  NONE DETECTED   Comment:            DRUG SCREEN FOR MEDICAL PURPOSES     ONLY.  IF CONFIRMATION IS NEEDED     FOR ANY PURPOSE, NOTIFY LAB     WITHIN 5 DAYS.                LOWEST DETECTABLE LIMITS     FOR URINE DRUG SCREEN     Drug Class       Cutoff (ng/mL)     Amphetamine      1000     Barbiturate      200     Benzodiazepine   626     Tricyclics       948     Opiates          300     Cocaine          300     THC              50  URINE MICROSCOPIC-ADD ON     Status: Abnormal   Collection  Time    11/12/13  8:06 AM      Result Value Ref Range   Squamous Epithelial / LPF FEW (*) RARE   WBC, UA 7-10  <3 WBC/hpf   RBC / HPF 0-2  <3 RBC/hpf   Bacteria, UA FEW (*) RARE   Urine-Other MUCOUS PRESENT    CBC WITH DIFFERENTIAL     Status: Abnormal   Collection Time    11/12/13  9:25 AM      Result Value Ref Range   WBC 8.3  4.0 - 10.5 K/uL   RBC 5.22 (*) 3.87 - 5.11 MIL/uL   Hemoglobin 16.5 (*) 12.0 - 15.0 g/dL   HCT 48.1 (*) 36.0 - 46.0 %   MCV 92.1  78.0 - 100.0 fL   MCH 31.6  26.0 - 34.0 pg   MCHC 34.3  30.0 - 36.0 g/dL   RDW 14.1  11.5 - 15.5 %   Platelets 219  150 - 400 K/uL   Neutrophils Relative % 43  43 - 77 %   Neutro Abs 3.6  1.7 - 7.7 K/uL   Lymphocytes Relative 44  12 - 46 %   Lymphs Abs 3.7  0.7 - 4.0 K/uL   Monocytes Relative 8  3 - 12 %   Monocytes Absolute 0.6  0.1 - 1.0 K/uL   Eosinophils Relative 5  0 - 5 %   Eosinophils Absolute 0.4  0.0 - 0.7 K/uL   Basophils Relative 0  0 - 1 %   Basophils Absolute 0.0  0.0 - 0.1 K/uL  COMPREHENSIVE METABOLIC PANEL     Status: Abnormal   Collection Time    11/12/13 10:42 AM      Result Value Ref Range   Sodium 142  137 - 147 mEq/L   Potassium 3.9  3.7 - 5.3 mEq/L   Chloride 101  96 - 112 mEq/L   CO2 29  19 - 32 mEq/L   Glucose, Bld 103 (*) 70 - 99 mg/dL   BUN 10  6 - 23 mg/dL   Creatinine, Ser 0.63  0.50 - 1.10 mg/dL   Calcium 9.1   8.4 - 10.5 mg/dL   Total Protein 7.4  6.0 - 8.3 g/dL   Albumin 3.4 (*) 3.5 - 5.2 g/dL   AST 20  0 - 37 U/L   ALT 21  0 - 35 U/L   Alkaline Phosphatase 120 (*) 39 - 117 U/L   Total Bilirubin 0.7  0.3 - 1.2 mg/dL   GFR calc non Af Amer >90  >90 mL/min   GFR calc Af Amer >90  >90 mL/min   Comment: (NOTE)     The eGFR has been calculated using the CKD EPI equation.     This calculation has not been validated in all clinical situations.     eGFR's persistently <90 mL/min signify possible Chronic Kidney     Disease.  ETHANOL     Status: None   Collection Time    11/12/13 10:42 AM      Result Value Ref Range   Alcohol, Ethyl (B) <11  0 - 11 mg/dL   Comment:            LOWEST DETECTABLE LIMIT FOR     SERUM ALCOHOL IS 11 mg/dL     FOR MEDICAL PURPOSES ONLY  ACETAMINOPHEN LEVEL     Status: None   Collection Time    11/12/13 10:42 AM      Result Value Ref  Range   Acetaminophen (Tylenol), Serum <15.0  10 - 30 ug/mL   Comment:            THERAPEUTIC CONCENTRATIONS VARY     SIGNIFICANTLY. A RANGE OF 10-30     ug/mL MAY BE AN EFFECTIVE     CONCENTRATION FOR MANY PATIENTS.     HOWEVER, SOME ARE BEST TREATED     AT CONCENTRATIONS OUTSIDE THIS     RANGE.     ACETAMINOPHEN CONCENTRATIONS     >150 ug/mL AT 4 HOURS AFTER     INGESTION AND >50 ug/mL AT 12     HOURS AFTER INGESTION ARE     OFTEN ASSOCIATED WITH TOXIC     REACTIONS.  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 Depression   . Anxiety    AXIS IV:  other psychosocial or environmental problems AXIS V:  51-60 moderate symptoms  Treatment Plan/Recommendations:   Supportive approach/coping skills/relapse prevention                                                                  Detox as needed. Note: Ms. Golonka wants to be D/C today . She wants to be placed on Suboxone maintenance. She wants to go and get back on a Suboxone clinic. She has a way of obtaining Suboxone while waiting for an appointment Treatment Plan Summary: Daily contact with patient to assess and evaluate symptoms and progress in treatment Medication management Current Medications:  Current Facility-Administered Medications  Medication Dose Route Frequency Provider Last Rate Last Dose  . acetaminophen (TYLENOL) tablet 650 mg  650 mg Oral Q6H PRN Encarnacion Slates, NP      . alum & mag hydroxide-simeth (MAALOX/MYLANTA) 200-200-20 MG/5ML suspension 30 mL  30 mL Oral Q4H PRN Encarnacion Slates, NP      . chlordiazePOXIDE (LIBRIUM) capsule 25 mg  25 mg Oral Q6H PRN Encarnacion Slates, NP      . Derrill Memo ON 11/16/2013] chlordiazePOXIDE (LIBRIUM) capsule 25 mg  25 mg Oral Daily Benjamine Mola, FNP       Followed by  . chlordiazePOXIDE (LIBRIUM) capsule 25 mg  25 mg Oral QID Benjamine Mola, FNP   25 mg at 11/13/13 2951   Followed by  . [START ON 11/14/2013] chlordiazePOXIDE (LIBRIUM) capsule 25 mg  25 mg Oral TID Benjamine Mola, FNP       Followed by  . [START ON 11/15/2013] chlordiazePOXIDE (LIBRIUM) capsule 25 mg  25 mg Oral BH-qamhs John C Withrow, FNP      . cloNIDine (CATAPRES) tablet 0.1 mg  0.1 mg Oral QID Encarnacion Slates, NP   0.1 mg  at 11/13/13 0935   Followed by  . [START ON 11/15/2013] cloNIDine (CATAPRES) tablet 0.1 mg  0.1 mg Oral BH-qamhs Encarnacion Slates, NP       Followed by  . [START ON 11/17/2013] cloNIDine (CATAPRES) tablet 0.1 mg  0.1 mg Oral QAC breakfast Encarnacion Slates, NP      .  dicyclomine (BENTYL) tablet 20 mg  20 mg Oral Q6H PRN Encarnacion Slates, NP   20 mg at 11/12/13 2035  . hydrOXYzine (ATARAX/VISTARIL) tablet 25 mg  25 mg Oral Q6H PRN Encarnacion Slates, NP      . loperamide (IMODIUM) capsule 2-4 mg  2-4 mg Oral PRN Encarnacion Slates, NP      . magnesium hydroxide (MILK OF MAGNESIA) suspension 30 mL  30 mL Oral Daily PRN Encarnacion Slates, NP      . methocarbamol (ROBAXIN) tablet 500 mg  500 mg Oral Q8H PRN Encarnacion Slates, NP   500 mg at 11/12/13 2307  . multivitamin with minerals tablet 1 tablet  1 tablet Oral Daily Encarnacion Slates, NP   1 tablet at 11/13/13 8329  . naproxen (NAPROSYN) tablet 500 mg  500 mg Oral BID PRN Encarnacion Slates, NP      . nicotine (NICODERM CQ - dosed in mg/24 hours) patch 21 mg  21 mg Transdermal Q0600 Encarnacion Slates, NP      . ondansetron (ZOFRAN-ODT) disintegrating tablet 4 mg  4 mg Oral Q6H PRN Encarnacion Slates, NP   4 mg at 11/13/13 0934  . thiamine (B-1) injection 100 mg  100 mg Intramuscular Once Encarnacion Slates, NP      . thiamine (VITAMIN B-1) tablet 100 mg  100 mg Oral Daily Encarnacion Slates, NP   100 mg at 11/13/13 0933  . traZODone (DESYREL) tablet 50 mg  50 mg Oral QHS Encarnacion Slates, NP   50 mg at 11/12/13 2310    Observation Level/Precautions:  15 minute checks  Laboratory:  As per the ED  Psychotherapy:  Individual/group  Medications:  Clonidine/Librium Detox  Consultations:    Discharge Concerns:  Plans to get in a Suboxone Clinic for maintenance  Estimated LOS: Will D/C to outpatient follow up as per her request  Other:     I certify that inpatient services furnished can reasonably be expected to improve the patient's condition.   Jumana Paccione A 2/20/201511:37 AM

## 2013-11-13 NOTE — Plan of Care (Signed)
Problem: Ineffective individual coping Goal: LTG: Patient will report a decrease in negative feelings Outcome: Adequate for Discharge Pt refuses all treatment. She denies si and hi Goal: STG: Patient will remain free from self harm Outcome: Adequate for Discharge Pt denies si and hi Goal: STG-Other (Specify): Outcome: Not Met (add Reason) Pt refuses all treatment  Problem: Consults Goal: Substance Abuse Patient Education See Patient Education Module for education specifics.  Outcome: Not Met (add Reason) Pt refuses treatment  Problem: Alteration in mood & ability to function due to Goal: LTG-Pt verbalizes understanding of importance of med regimen (Patient verbalizes understanding of importance of medication regimen and need to continue outpatient care and support groups)  Outcome: Not Met (add Reason) Pt refuses treatment Goal: LTG-Pt is able to verbalize triggers for his/her abuse (Patient is able to verbalize triggers for his/her abuse and strategies to maintain sobriety)  Outcome: Not Met (add Reason) Pt refuses treatment Goal: STG: Patient verbalizes decreases in signs of withdrawal Outcome: Not Met (add Reason) Pt refuses treatment Goal: STG-Patient will report withdrawal symptoms Outcome: Not Met (add Reason) Pt refuses treatment

## 2013-11-13 NOTE — Progress Notes (Signed)
Pt reports that her boyfriend with pick her up at 1630. Discharge papers given to pt with resources and suicide prevention information. Pt refuses to sign. Pt denies si and hi.

## 2013-11-13 NOTE — Progress Notes (Signed)
D:Pt is in the bed and refused to get out of bed for medications. Pt reports nausea, cravings and chilling. Pt rates pain as a 10 on 1-10 scale with 10 being the most pain. She denies hopelessness and rates depression as an 8 on 1-10 scale with 10 being the most depressed.   A:Scheduled medication taken to pt's room. Pt given gatorade and prn medication for nausea. Offered tylenol and pt refused stating that she wants "to be left alone." Pt given extra blanket for c/o chills. R:Pt denies si and hi. Safety maintained on the unit.

## 2013-11-13 NOTE — Progress Notes (Signed)
Adult Psychoeducational Group Note  Date:  11/13/2013 Time:  12:45 PM  Group Topic/Focus:  Early Warning Signs:   The focus of this group is to help patients identify signs or symptoms they exhibit before slipping into an unhealthy state or crisis.  Participation Level:  Did not attend.  Additional Comments:  Pt was in bed asleep.  Janice Simmons 11/13/2013, 12:45 PM 

## 2013-11-13 NOTE — Progress Notes (Signed)
Patient ID: Janice Simmons, female   DOB: March 28, 1981, 33 y.o.   MRN: [id number] Assumed patient care at 1500 from prior shift. Per report, patient discharge instructions reviewed and patient refuses follow up care. Writer reviewed with patient and she states '' I don't need any follow up I'm not doing it. My ride will be here at 430 to pick me up '' Pt denies any SI/HI at this time. No signs of acute decompensation.All belongings returned. Pt escorted from unit to lobby to care of boyfriend.

## 2013-11-13 NOTE — Tx Team (Signed)
Interdisciplinary Treatment Plan Update (Adult)  Date: 11/13/2013  Time Reviewed:  9:45 AM  Progress in Treatment: Attending groups: Yes Participating in groups:  Yes Taking medication as prescribed:  Yes Tolerating medication:  Yes Family/Significant othe contact made: CSW assessing Patient understands diagnosis:  Yes Discussing patient identified problems/goals with staff:  Yes Medical problems stabilized or resolved:  Yes Denies suicidal/homicidal ideation: Yes Issues/concerns per patient self-inventory:  Yes Other:  New problem(s) identified: N/A  Discharge Plan or Barriers: CSW assessing for appropriate referrals.    Reason for Continuation of Hospitalization: Anxiety Depression Detox Medication Stabilization  Comments: N/A  Estimated length of stay: 3-5 days   For review of initial/current patient goals, please see plan of care.  Attendees: Patient:     Family:     Physician:  Dr. Lugo 11/13/2013 9:46 AM   Nursing:   Jan Wright, RN 11/13/2013 9:46 AM   Clinical Social Worker:  Adewale Pucillo Horton, LCSW 11/13/2013 9:46 AM   Other: Aggie Nwoko, NP 11/13/2013 9:46 AM   Other: Ronecia Byrd, RN 11/13/2013 9:46 AM   Other:  Jennifer Clark, case manager 11/13/2013 9:49 AM   Other:     Other:    Other:    Other:    Other:    Other:    Other:     Scribe for Treatment Team:   Horton, Nasiya Pascual Nicole, 11/13/2013 , 9:46 AM    

## 2013-11-14 NOTE — Discharge Summary (Signed)
Physician Discharge Summary Note  Patient:  Janice Simmons is an 33 y.o., female MRN:  [id number] DOB:  03-11-1981 Patient phone:  [phone] (home)  Patient address:   [place] [address],  Total Time spent with patient: Greater than 30 minutes  Date of Admission:  11/12/2013 Date of Discharge: 11/13/13  Reason for Admission:  Drug detox  Discharge Diagnoses: Active Problems:   Polysubstance (including opioids) dependence with physiological dependence    Psychiatric Specialty Exam: Physical Exam: Psychiatric Specialty Exam:  Physical Exam   Review of Systems  Constitutional: Positive for malaise/fatigue.  Eyes: Negative.  Respiratory: Negative.  Cardiovascular: Negative.  Gastrointestinal: Positive for nausea.  Genitourinary: Negative.  Musculoskeletal: Positive for back pain, joint pain and myalgias.  Skin: Negative.  Neurological: Positive for weakness and headaches.  Endo/Heme/Allergies: Negative.  Psychiatric/Behavioral: Positive for depression and substance abuse. The patient is nervous/anxious and has insomnia.      Review of Systems  Constitutional: Positive for chills and malaise/fatigue.  HENT: Negative.   Eyes: Negative.   Respiratory: Negative.   Cardiovascular: Negative.   Gastrointestinal: Negative.   Genitourinary: Negative.   Musculoskeletal: Positive for myalgias.  Skin: Negative.   Neurological: Positive for dizziness.  Endo/Heme/Allergies: Negative.   Psychiatric/Behavioral: Positive for substance abuse. The patient is nervous/anxious and has insomnia.     Blood pressure 112/68, pulse 84, temperature 97.7 F (36.5 C), temperature source Oral, resp. rate 18, height 6' (1.829 m), weight 98.884 kg (218 lb), last menstrual period 11/06/2013, SpO2 97.00%.Body mass index is 29.56 kg/(m^2).    Blood pressure 127/79, pulse 60, temperature 97.7 F (36.5 C), temperature source Oral, resp. rate 16, height 6' (1.829 m), weight 98.884 kg  (218 lb), last menstrual period 11/06/2013, SpO2 97.00%.Body mass index is 29.56 kg/(m^2).   General Appearance: Disheveled   Eye Sport and exercise psychologist:: Fair   Speech: Clear and Coherent   Volume: Decreased   Mood: Anxious and "withdrawing"   Affect: anxious, worried   Thought Process: Coherent and Goal Directed   Orientation: Full (Time, Place, and Person)   Thought Content: symptoms, wanting to be back on Suboxone maintenance   Suicidal Thoughts: No   Homicidal Thoughts: No   Memory: Immediate; Fair  Recent; Fair  Remote; Fair   Judgement: Fair   Insight: Shallow   Psychomotor Activity: Restlessness   Concentration: Fair   Recall: [employer] of Knowledge:NA   Language: Fair   Akathisia: No   Handed:   AIMS (if indicated):   Assets: Housing  Social Support   Sleep: Number of Hours: 4.5                                           Diagnosis:   Hospitalizations:[facility], [place], [place]   Outpatient Care: Not currently   Substance Abuse Care: [place], [place]   Self-Mutilation: Denies   Suicidal Attempts: Denies   Violent Behaviors: Denies    Musculoskeletal: Strength & Muscle Tone: within normal limits Gait & Station: normal Patient leans: N/A  DSM5: Schizophrenia Disorders:  NA Obsessive-Compulsive Disorders:  NA Trauma-Stressor Disorders:  NA Substance/Addictive Disorders:  Polysubstance (including opioids) dependence with physiological dependence Depressive Disorders:  NA  Axis Diagnosis:   AXIS I:  Polysubstance (including opioids) dependence with physiological dependence AXIS II:  Deferred AXIS III:   Past Medical History  Diagnosis Date  . Depression   .  Anxiety    AXIS IV:  Polysubstance dependence AXIS V:  41-50 serious symptoms  Level of Care:  OP  Hospital Course:  33 Y/O female who states that she is having a hard time with detox. She has been using Heroin 2 grams a day (IV) as well as Xanax. States she takes up  to 10 of the Xanax 1 mg daily. Has been in several detox, residential treatment centers. She was last in [place]. 8 months ago. Was on Subxone 3 months ago,. States she got with the wrong people and went back to Heroin. States she has to be on maintenance Suboxone or Methadone or she relapses. She also reports she drinks, and uses cocaine. She is living with a boyfriend who supports her use. She does not work (has not work "ever") states when she is not "high" she is depressed or angry.  After admission assessment/evaluation coupled with UDS reports that indicated (+) opiates, Benzodiazepine and THC, it was determined that [name] needed detoxification treatment to re-stabilize her systems of drug intoxifications. She was started on both Librium/Clonidine detox protocols. She was also enrolled in group counseling sessions/activties to learn coping skills.   However, [name] demanded to be on Suboxen treatment for her detox as that is what she preferred. When her request was declined, and she was informed that she will be detoxed from these substances using Librium and clonidine treatment protocols.  But [name] decided that she would rather be discharged to her home. She declined to remain in this hospital for drug detox. She also declined referral to any treatment centers and or outpatient clinic for further treatment. She adamantly denies any SIHI, AVH, delusional thoughts and or paranoia. [name] indicated that she rather go home and detox from these substances on her own, for she had done so on other numerous occasions. She left [facility] with all personal belongings via personal arranged transport.  Consults:  psychiatry  Significant Diagnostic Studies:  labs: Reports reviewed, no changes  Discharge Vitals:   Blood pressure 112/68, pulse 84, temperature 97.7 F (36.5 C), temperature source Oral, resp. rate 18, height 6' (1.829 m), weight 98.884 kg (218 lb), last menstrual period 11/06/2013, SpO2  97.00%. Body mass index is 29.56 kg/(m^2). Lab Results:   Results for orders placed during the hospital encounter of 11/12/13 (from the past 72 hour(s))  URINALYSIS, ROUTINE W REFLEX MICROSCOPIC     Status: Abnormal   Collection Time    11/12/13  8:06 AM      Result Value Ref Range   Color, Urine YELLOW  YELLOW   APPearance CLOUDY (*) CLEAR   Specific Gravity, Urine 1.016  1.005 - 1.030   pH 6.0  5.0 - 8.0   Glucose, UA NEGATIVE  NEGATIVE mg/dL   Hgb urine dipstick NEGATIVE  NEGATIVE   Bilirubin Urine NEGATIVE  NEGATIVE   Ketones, ur NEGATIVE  NEGATIVE mg/dL   Protein, ur NEGATIVE  NEGATIVE mg/dL   Urobilinogen, UA 0.2  0.0 - 1.0 mg/dL   Nitrite NEGATIVE  NEGATIVE   Leukocytes, UA SMALL (*) NEGATIVE  URINE RAPID DRUG SCREEN (HOSP PERFORMED)     Status: Abnormal   Collection Time    11/12/13  8:06 AM      Result Value Ref Range   Opiates POSITIVE (*) NONE DETECTED   Cocaine NONE DETECTED  NONE DETECTED   Benzodiazepines POSITIVE (*) NONE DETECTED   Amphetamines NONE DETECTED  NONE DETECTED   Tetrahydrocannabinol NONE DETECTED  NONE DETECTED  Barbiturates NONE DETECTED  NONE DETECTED   Comment:            DRUG SCREEN FOR MEDICAL PURPOSES     ONLY.  IF CONFIRMATION IS NEEDED     FOR ANY PURPOSE, NOTIFY LAB     WITHIN 5 DAYS.                LOWEST DETECTABLE LIMITS     FOR URINE DRUG SCREEN     Drug Class       Cutoff (ng/mL)     Amphetamine      1000     Barbiturate      200     Benzodiazepine   631     Tricyclics       497     Opiates          300     Cocaine          300     THC              50  URINE MICROSCOPIC-ADD ON     Status: Abnormal   Collection Time    11/12/13  8:06 AM      Result Value Ref Range   Squamous Epithelial / LPF FEW (*) RARE   WBC, UA 7-10  <3 WBC/hpf   RBC / HPF 0-2  <3 RBC/hpf   Bacteria, UA FEW (*) RARE   Urine-Other MUCOUS PRESENT    CBC WITH DIFFERENTIAL     Status: Abnormal   Collection Time    11/12/13  9:25 AM      Result Value  Ref Range   WBC 8.3  4.0 - 10.5 K/uL   RBC 5.22 (*) 3.87 - 5.11 MIL/uL   Hemoglobin 16.5 (*) 12.0 - 15.0 g/dL   HCT 48.1 (*) 36.0 - 46.0 %   MCV 92.1  78.0 - 100.0 fL   MCH 31.6  26.0 - 34.0 pg   MCHC 34.3  30.0 - 36.0 g/dL   RDW 14.1  11.5 - 15.5 %   Platelets 219  150 - 400 K/uL   Neutrophils Relative % 43  43 - 77 %   Neutro Abs 3.6  1.7 - 7.7 K/uL   Lymphocytes Relative 44  12 - 46 %   Lymphs Abs 3.7  0.7 - 4.0 K/uL   Monocytes Relative 8  3 - 12 %   Monocytes Absolute 0.6  0.1 - 1.0 K/uL   Eosinophils Relative 5  0 - 5 %   Eosinophils Absolute 0.4  0.0 - 0.7 K/uL   Basophils Relative 0  0 - 1 %   Basophils Absolute 0.0  0.0 - 0.1 K/uL  COMPREHENSIVE METABOLIC PANEL     Status: Abnormal   Collection Time    11/12/13 10:42 AM      Result Value Ref Range   Sodium 142  137 - 147 mEq/L   Potassium 3.9  3.7 - 5.3 mEq/L   Chloride 101  96 - 112 mEq/L   CO2 29  19 - 32 mEq/L   Glucose, Bld 103 (*) 70 - 99 mg/dL   BUN 10  6 - 23 mg/dL   Creatinine, Ser 0.63  0.50 - 1.10 mg/dL   Calcium 9.1  8.4 - 10.5 mg/dL   Total Protein 7.4  6.0 - 8.3 g/dL   Albumin 3.4 (*) 3.5 - 5.2 g/dL   AST 20  0 - 37 U/L   ALT 21  0 - 35 U/L   Alkaline Phosphatase 120 (*) 39 - 117 U/L   Total Bilirubin 0.7  0.3 - 1.2 mg/dL   GFR calc non Af Amer >90  >90 mL/min   GFR calc Af Amer >90  >90 mL/min   Comment: (NOTE)     The eGFR has been calculated using the CKD EPI equation.     This calculation has not been validated in all clinical situations.     eGFR's persistently <90 mL/min signify possible Chronic Kidney     Disease.  ETHANOL     Status: None   Collection Time    11/12/13 10:42 AM      Result Value Ref Range   Alcohol, Ethyl (B) <11  0 - 11 mg/dL   Comment:            LOWEST DETECTABLE LIMIT FOR     SERUM ALCOHOL IS 11 mg/dL     FOR MEDICAL PURPOSES ONLY  ACETAMINOPHEN LEVEL     Status: None   Collection Time    11/12/13 10:42 AM      Result Value Ref Range   Acetaminophen (Tylenol),  Serum <15.0  10 - 30 ug/mL   Comment:            THERAPEUTIC CONCENTRATIONS VARY     SIGNIFICANTLY. A RANGE OF 10-30     ug/mL MAY BE AN EFFECTIVE     CONCENTRATION FOR MANY PATIENTS.     HOWEVER, SOME ARE BEST TREATED     AT CONCENTRATIONS OUTSIDE THIS     RANGE.     ACETAMINOPHEN CONCENTRATIONS     >150 ug/mL AT 4 HOURS AFTER     INGESTION AND >50 ug/mL AT 12     HOURS AFTER INGESTION ARE     OFTEN ASSOCIATED WITH TOXIC     REACTIONS.  SALICYLATE LEVEL     Status: Abnormal   Collection Time    11/12/13 10:42 AM      Result Value Ref Range   Salicylate Lvl <1.4 (*) 2.8 - 20.0 mg/dL    Physical Findings: AIMS: Facial and Oral Movements Muscles of Facial Expression: None, normal Lips and Perioral Area: None, normal Jaw: None, normal Tongue: None, normal,Extremity Movements Upper (arms, wrists, hands, fingers): None, normal Lower (legs, knees, ankles, toes): None, normal, Trunk Movements Neck, shoulders, hips: None, normal, Overall Severity Severity of abnormal movements (highest score from questions above): None, normal Incapacitation due to abnormal movements: None, normal Patient's awareness of abnormal movements (rate only patient's report): No Awareness, Dental Status Current problems with teeth and/or dentures?: No Does patient usually wear dentures?: No  CIWA:  CIWA-Ar Total: 9 COWS:  COWS Total Score: 8  Psychiatric Specialty Exam: See Psychiatric Specialty Exam and Suicide Risk Assessment completed by Attending Physician prior to discharge.  Discharge destination:  Home  Is patient on multiple antipsychotic therapies at discharge:  No   Has Patient had three or more failed trials of antipsychotic monotherapy by history:  No  Recommended Plan for Multiple Antipsychotic Therapies: NA     Medication List    Notice   You have not been prescribed any medications.     Follow-up Information   Follow up with Patient refused. . (Patient refused all resources)       Follow-up recommendations:  Activity:  As tolerated Diet: As recommended by your primary care doctor. Keep all scheduled follow-up appointments as recommended. Continue to work your relapse prevention plan Comments:  Take  all your medications as prescribed by your mental healthcare provider. Report any adverse effects and or reactions from your medicines to your outpatient provider promptly. Patient is instructed and cautioned to not engage in alcohol and or illegal drug use while on prescription medicines. In the event of worsening symptoms, patient is instructed to call the crisis hotline, 911 and or go to the nearest ED for appropriate evaluation and treatment of symptoms. Follow-up with your primary care provider for your other medical issues, concerns and or health care needs.   Total Discharge Time:  Greater than 30 minutes.  Signed: Encarnacion Slates, [place] 11/14/2013, 5:26 PM Agree with assessment and plan Woodroe Chen. Sabra Heck, M.D.

## 2013-11-16 NOTE — ED Provider Notes (Signed)
Medical screening examination/treatment/procedure(s) were conducted as a shared visit with non-physician practitioner(s) and myself.  I personally evaluated the patient during the encounter.  EKG Interpretation   None       Pt seen and examined.  History reviewed.  Last use within 24 hours.  Hemodynamically stable.  Anxious. Cleared for inpatient treatment for abuse issues.  Rolland PorterMark Jessenya Berdan, MD 11/16/13 [phone]

## 2013-11-17 NOTE — Progress Notes (Signed)
Patient Discharge Instructions:  No documentation was faxed for HBIPS.  Per the SW the patient refused follow up.  Janice ReddenSheena E [place], 11/17/2013, 3:26 PM

## 2014-04-12 ENCOUNTER — Encounter (HOSPITAL_COMMUNITY): Payer: Self-pay | Admitting: Emergency Medicine

## 2014-04-12 DIAGNOSIS — F329 Major depressive disorder, single episode, unspecified: Secondary | ICD-10-CM | POA: Diagnosis present

## 2014-04-12 DIAGNOSIS — F17200 Nicotine dependence, unspecified, uncomplicated: Secondary | ICD-10-CM | POA: Diagnosis present

## 2014-04-12 DIAGNOSIS — F1010 Alcohol abuse, uncomplicated: Secondary | ICD-10-CM | POA: Diagnosis present

## 2014-04-12 DIAGNOSIS — L03119 Cellulitis of unspecified part of limb: Secondary | ICD-10-CM

## 2014-04-12 DIAGNOSIS — A419 Sepsis, unspecified organism: Principal | ICD-10-CM | POA: Diagnosis present

## 2014-04-12 DIAGNOSIS — F1910 Other psychoactive substance abuse, uncomplicated: Secondary | ICD-10-CM | POA: Diagnosis present

## 2014-04-12 DIAGNOSIS — F411 Generalized anxiety disorder: Secondary | ICD-10-CM | POA: Diagnosis present

## 2014-04-12 DIAGNOSIS — R011 Cardiac murmur, unspecified: Secondary | ICD-10-CM | POA: Diagnosis present

## 2014-04-12 DIAGNOSIS — F3289 Other specified depressive episodes: Secondary | ICD-10-CM | POA: Diagnosis present

## 2014-04-12 DIAGNOSIS — L02419 Cutaneous abscess of limb, unspecified: Secondary | ICD-10-CM | POA: Diagnosis present

## 2014-04-12 MED ORDER — ACETAMINOPHEN 325 MG PO TABS
650.0000 mg | ORAL_TABLET | Freq: Once | ORAL | Status: AC
  Administered 2014-04-12: 650 mg via ORAL

## 2014-04-12 NOTE — ED Notes (Addendum)
Pt states abscess on her upper right thigh and a knot that have been there for 2 days. Pt states no fever or chills, just concerned. Pt right outer thigh has redness and an opening in the center of the redness (slightly like a hole.)

## 2014-04-13 ENCOUNTER — Encounter (HOSPITAL_COMMUNITY): Payer: Self-pay | Admitting: Internal Medicine

## 2014-04-13 ENCOUNTER — Inpatient Hospital Stay (HOSPITAL_COMMUNITY)
Admission: EM | Admit: 2014-04-13 | Discharge: 2014-04-13 | DRG: 872 | Discharge status: Against Medical Advice | Payer: Self-pay | Attending: Internal Medicine | Admitting: Internal Medicine

## 2014-04-13 DIAGNOSIS — L0390 Cellulitis, unspecified: Secondary | ICD-10-CM | POA: Diagnosis present

## 2014-04-13 DIAGNOSIS — L02419 Cutaneous abscess of limb, unspecified: Secondary | ICD-10-CM

## 2014-04-13 DIAGNOSIS — L03115 Cellulitis of right lower limb: Secondary | ICD-10-CM

## 2014-04-13 DIAGNOSIS — F1910 Other psychoactive substance abuse, uncomplicated: Secondary | ICD-10-CM | POA: Diagnosis present

## 2014-04-13 DIAGNOSIS — L03119 Cellulitis of unspecified part of limb: Secondary | ICD-10-CM

## 2014-04-13 DIAGNOSIS — F1990 Other psychoactive substance use, unspecified, uncomplicated: Secondary | ICD-10-CM

## 2014-04-13 DIAGNOSIS — A419 Sepsis, unspecified organism: Secondary | ICD-10-CM

## 2014-04-13 HISTORY — DX: Other psychoactive substance use, unspecified, uncomplicated: F19.90

## 2014-04-13 HISTORY — DX: Inflammatory liver disease, unspecified: K75.9

## 2014-04-13 HISTORY — DX: Other psychoactive substance abuse, uncomplicated: F19.10

## 2014-04-13 LAB — CBC WITH DIFFERENTIAL/PLATELET
Basophils Absolute: 0 10*3/uL (ref 0.0–0.1)
Basophils Relative: 0 % (ref 0–1)
EOS PCT: 1 % (ref 0–5)
Eosinophils Absolute: 0.1 10*3/uL (ref 0.0–0.7)
HCT: 44.6 % (ref 36.0–46.0)
Hemoglobin: 15.2 g/dL — ABNORMAL HIGH (ref 12.0–15.0)
LYMPHS ABS: 3.6 10*3/uL (ref 0.7–4.0)
LYMPHS PCT: 27 % (ref 12–46)
MCH: 31 pg (ref 26.0–34.0)
MCHC: 34.1 g/dL (ref 30.0–36.0)
MCV: 90.8 fL (ref 78.0–100.0)
Monocytes Absolute: 1.5 10*3/uL — ABNORMAL HIGH (ref 0.1–1.0)
Monocytes Relative: 11 % (ref 3–12)
NEUTROS PCT: 61 % (ref 43–77)
Neutro Abs: 8.1 10*3/uL — ABNORMAL HIGH (ref 1.7–7.7)
Platelets: 200 10*3/uL (ref 150–400)
RBC: 4.91 MIL/uL (ref 3.87–5.11)
RDW: 14.4 % (ref 11.5–15.5)
WBC: 13.2 10*3/uL — ABNORMAL HIGH (ref 4.0–10.5)

## 2014-04-13 LAB — BASIC METABOLIC PANEL
Anion gap: 16 — ABNORMAL HIGH (ref 5–15)
BUN: 11 mg/dL (ref 6–23)
CO2: 26 mEq/L (ref 19–32)
Calcium: 9.4 mg/dL (ref 8.4–10.5)
Chloride: 94 mEq/L — ABNORMAL LOW (ref 96–112)
Creatinine, Ser: 0.56 mg/dL (ref 0.50–1.10)
GFR calc Af Amer: >90 mL/min (ref >90)
GFR calc non Af Amer: >90 mL/min (ref >90)
GLUCOSE: 82 mg/dL (ref 70–99)
POTASSIUM: 3.8 meq/L (ref 3.7–5.3)
SODIUM: 136 meq/L — AB (ref 137–147)

## 2014-04-13 LAB — RAPID URINE DRUG SCREEN, HOSP PERFORMED
Amphetamines: NOT DETECTED
BARBITURATES: NOT DETECTED
BENZODIAZEPINES: NOT DETECTED
Cocaine: NOT DETECTED
Opiates: POSITIVE — AB
Tetrahydrocannabinol: NOT DETECTED

## 2014-04-13 LAB — URINALYSIS, ROUTINE W REFLEX MICROSCOPIC
Bilirubin Urine: NEGATIVE
GLUCOSE, UA: NEGATIVE mg/dL
HGB URINE DIPSTICK: NEGATIVE
Ketones, ur: NEGATIVE mg/dL
Nitrite: NEGATIVE
PH: 7 (ref 5.0–8.0)
Protein, ur: NEGATIVE mg/dL
Specific Gravity, Urine: 1.016 (ref 1.005–1.030)
Urobilinogen, UA: 2 mg/dL — ABNORMAL HIGH (ref 0.0–1.0)

## 2014-04-13 LAB — URINE MICROSCOPIC-ADD ON

## 2014-04-13 LAB — PREGNANCY, URINE: PREG TEST UR: NEGATIVE

## 2014-04-13 MED ORDER — CLINDAMYCIN PHOSPHATE 600 MG/50ML IV SOLN
600.0000 mg | Freq: Once | INTRAVENOUS | Status: AC
  Administered 2014-04-13: 600 mg via INTRAVENOUS
  Filled 2014-04-13: qty 50

## 2014-04-13 MED ORDER — HYDROMORPHONE HCL PF 1 MG/ML IJ SOLN
2.0000 mg | Freq: Once | INTRAMUSCULAR | Status: AC
  Administered 2014-04-13: 2 mg via INTRAVENOUS
  Filled 2014-04-13: qty 2

## 2014-04-13 MED ORDER — HYDROMORPHONE HCL PF 1 MG/ML IJ SOLN
1.0000 mg | INTRAMUSCULAR | Status: DC | PRN
  Administered 2014-04-13 (×2): 1 mg via INTRAVENOUS
  Filled 2014-04-13 (×3): qty 1

## 2014-04-13 MED ORDER — ACETAMINOPHEN 325 MG PO TABS: 650.0000 mg | ORAL_TABLET | Freq: Four times a day (QID) | ORAL | Status: DC | PRN

## 2014-04-13 MED ORDER — SODIUM CHLORIDE 0.9 % IV SOLN: INTRAVENOUS | Status: DC

## 2014-04-13 MED ORDER — HYDROMORPHONE HCL PF 1 MG/ML IJ SOLN
1.0000 mg | Freq: Once | INTRAMUSCULAR | Status: AC
  Administered 2014-04-13: 1 mg via INTRAVENOUS
  Filled 2014-04-13: qty 1

## 2014-04-13 MED ORDER — ONDANSETRON HCL 4 MG/2ML IJ SOLN
4.0000 mg | Freq: Four times a day (QID) | INTRAMUSCULAR | Status: DC | PRN
  Administered 2014-04-13: 4 mg via INTRAVENOUS
  Filled 2014-04-13: qty 2

## 2014-04-13 MED ORDER — ONDANSETRON HCL 4 MG PO TABS: 4.0000 mg | ORAL_TABLET | Freq: Four times a day (QID) | ORAL | Status: DC | PRN

## 2014-04-13 MED ORDER — TETANUS-DIPHTHERIA TOXOIDS TD 5-2 LFU IM INJ
0.5000 mL | INJECTION | Freq: Once | INTRAMUSCULAR | Status: AC
  Administered 2014-04-13: 0.5 mL via INTRAMUSCULAR
  Filled 2014-04-13: qty 0.5

## 2014-04-13 MED ORDER — VANCOMYCIN HCL IN DEXTROSE 1-5 GM/200ML-% IV SOLN
1000.0000 mg | Freq: Three times a day (TID) | INTRAVENOUS | Status: DC
  Administered 2014-04-13: 1000 mg via INTRAVENOUS
  Filled 2014-04-13 (×3): qty 200

## 2014-04-13 MED ORDER — SODIUM CHLORIDE 0.9 % IV BOLUS (SEPSIS)
1000.0000 mL | Freq: Once | INTRAVENOUS | Status: AC
  Administered 2014-04-13: 1000 mL via INTRAVENOUS

## 2014-04-13 MED ORDER — PIPERACILLIN-TAZOBACTAM 3.375 G IVPB 30 MIN
3.3750 g | Freq: Once | INTRAVENOUS | Status: DC
  Filled 2014-04-13: qty 50

## 2014-04-13 MED ORDER — VANCOMYCIN HCL IN DEXTROSE 1-5 GM/200ML-% IV SOLN
1000.0000 mg | Freq: Once | INTRAVENOUS | Status: AC
  Administered 2014-04-13: 1000 mg via INTRAVENOUS
  Filled 2014-04-13: qty 200

## 2014-04-13 MED ORDER — ACETAMINOPHEN 650 MG RE SUPP: 650.0000 mg | Freq: Four times a day (QID) | RECTAL | Status: DC | PRN

## 2014-04-13 MED ORDER — MORPHINE SULFATE 4 MG/ML IJ SOLN
4.0000 mg | Freq: Once | INTRAMUSCULAR | Status: AC
  Administered 2014-04-13: 4 mg via INTRAVENOUS
  Filled 2014-04-13: qty 1

## 2014-04-13 MED ORDER — PIPERACILLIN-TAZOBACTAM 3.375 G IVPB
3.3750 g | Freq: Three times a day (TID) | INTRAVENOUS | Status: DC
  Administered 2014-04-13: 3.375 g via INTRAVENOUS
  Filled 2014-04-13 (×3): qty 50

## 2014-04-13 NOTE — Progress Notes (Signed)
ANTIBIOTIC CONSULT NOTE - INITIAL  Pharmacy Consult for Vancocin and Zosyn Indication: cellulitis  No Known Allergies  Patient Measurements: Height: 6' (182.9 cm) Weight: 214 lb 3 oz (97.155 kg) IBW/kg (Calculated) : 73.1  Vital Signs: Temp: 99 F (37.2 C) (07/21 0516) Temp src: Oral (07/21 0516) BP: 114/68 mmHg (07/21 0500) Pulse Rate: 92 (07/21 0500)  Labs:  Recent Labs  04/13/14 0144  WBC 13.2*  HGB 15.2*  PLT 200  CREATININE 0.56   Estimated Creatinine Clearance: 134.2 ml/min (by C-G formula based on Cr of 0.56).   Microbiology: No results found for this or any previous visit (from the past 720 hour(s)).  Medical History: Past Medical History  Diagnosis Date  . Depression   . Anxiety   . Polysubstance abuse   . IVDU (intravenous drug user)     Medications:  No prescriptions prior to admission   Scheduled:  . piperacillin-tazobactam  3.375 g Intravenous Once  . piperacillin-tazobactam (ZOSYN)  IV  3.375 g Intravenous Q8H  . vancomycin  1,000 mg Intravenous Q8H   Infusions:  . sodium chloride      Assessment: 33yo female w/ h/o IV drug abuse c/o worsening erythema and swelling of thigh, to begin IV ABX for cellulitis.  Goal of Therapy:  Vancomycin trough level 10-15 mcg/ml  Plan:  Will begin vancomycin 1g IV Q8H and Zosyn 3.375g IV Q8H and monitor CBC, Cx, levels prn.  Vernard GamblesVeronda Sydell Prowell, PharmD, BCPS  04/13/2014,5:50 AM

## 2014-04-13 NOTE — Progress Notes (Signed)
Pt requesting pain med but when returned to room pt sleeping. I ask her pain level but she returns to sleep. Will hold off from giving it at moment.

## 2014-04-13 NOTE — ED Provider Notes (Signed)
CSN: [id number]     Arrival date & time 04/12/14  2257 History   First MD Initiated Contact with Patient 04/13/14 0104     Chief Complaint  Patient presents with  . Abscess     (Consider location/radiation/quality/duration/timing/severity/associated sxs/prior Treatment) HPI This is a 33 year old IV drug user, he did heroine. She presents with complaints of 3 days of increasingly severe erythema pain and now draining wound over the lateral aspect of the right thigh. She denies skin popping in this region. She has had a subjective fever for the last 2 days. She has not checked her temperature with a thermometer.  Her pain as 10 over 10 in severity, aching, burning and constant. She notes that she has a knot in her right inguinal region. She says she has history of a soft tissue infections secondary to IV drug use. She has not been recently treated with antibiotics. She says she has never had infectious endocarditis and hasn't been told of a heart murmur.  Pain in her right leg is worse with weightbearing  and certain movements of the right leg.   Past Medical History  Diagnosis Date  . Depression   . Anxiety    Past Surgical History  Procedure Laterality Date  . Tonsillectomy     History reviewed. No pertinent family history. History  Substance Use Topics  . Smoking status: Current Every Day Smoker -- 2.00 packs/day    Types: Cigarettes  . Smokeless tobacco: Not on file  . Alcohol Use: Yes     Comment: pt reports binge drinking    OB History   Grav Para Term Preterm Abortions TAB SAB Ect Mult Living                 Review of Systems  Ten point review of symptoms performed and is negative with the exception of symptoms noted above.   Allergies  Review of patient's allergies indicates no known allergies.  Home Medications   Prior to Admission medications   Not on File   BP 144/97  Pulse 117  Temp(Src) 101.4 F (38.6 C) (Oral)  Resp 16  Ht 6' (1.829 m)  Wt 214 lb  3 oz (97.155 kg)  BMI 29.04 kg/m2  SpO2 95%  LMP 03/29/2014 Physical Exam Gen: well developed and well nourished appearing, ill appearing Head: NCAT Eyes: PERL, EOMI Nose: no epistaixis or rhinorrhea Mouth/throat: mucosa is moist and pink Neck: supple, no stridor Lungs: CTA B, no wheezing, rhonchi or rales respiratory rate 24 per minute CV: Rapid and regular, pulse 120 beats per minute, 2/6 SEM, extremities appear well perfused.  Abd: soft, notender, nondistended Back: no ttp, no cva ttp Skin: warm and dry Ext: RLE: approx 10cm x 10cm region of induration with erythema, increased warmth and exquisite ttp over the lateral right mid thigh. Central wound draining pus, right inguinal adenopathy noted.  Neuro: CN ii-xii grossly intact, no focal deficits Psyche; normal affect,  calm and cooperative.   ED Course  Procedures (including critical care time) Labs Review Labs Reviewed  BASIC METABOLIC PANEL - Abnormal; Notable for the following:    Sodium 136 (*)    Chloride 94 (*)    Anion gap 16 (*)    All other components within normal limits  CBC WITH DIFFERENTIAL - Abnormal; Notable for the following:    WBC 13.2 (*)    Hemoglobin 15.2 (*)    Neutro Abs 8.1 (*)    Monocytes Absolute 1.5 (*)  All other components within normal limits  URINALYSIS, ROUTINE W REFLEX MICROSCOPIC - Abnormal; Notable for the following:    Color, Urine AMBER (*)    APPearance CLOUDY (*)    Urobilinogen, UA 2.0 (*)    Leukocytes, UA SMALL (*)    All other components within normal limits  URINE MICROSCOPIC-ADD ON - Abnormal; Notable for the following:    Squamous Epithelial / LPF MANY (*)    All other components within normal limits  CULTURE, BLOOD (ROUTINE X 2)  CULTURE, BLOOD (ROUTINE X 2)    I  MDM   Final diagnoses:  IVDU (intravenous drug user)   Patient with cellulitis, draining abscess and sepsis secondary to IVDU. She is also noted to have a soft systolic heart murmur which, the  patient says, has not previously been identified. This raises the concern, of course, for IE. We have obtained 3 sets of blood cultures and started empiric abx tx with Vancomycin and Clindamycin. Case discussed with Dr. Toniann FailKakrakandy and he will see and admit.     Brandt LoosenJulie Manly, MD 04/13/14 [phone]

## 2014-04-13 NOTE — Progress Notes (Signed)
Patient eloped from unit. IV pole with patient's IV catheter found "intact" in elevator by [employer] staff with gown. Schorr, NP notified.

## 2014-04-13 NOTE — ED Notes (Signed)
Admitting MD at bedside.

## 2014-04-13 NOTE — Progress Notes (Signed)
PATIENT DETAILS Name: Janice Simmons Age: 33 y.o. Sex: female Date of Birth: [birth year] Admit Date: 04/13/2014 Admitting Physician Eduard Clos, MD PCP:No PCP Per Patient  Subjective: Patient admitted for right posterior thigh cellulitis and abscess of approximately 5 cm. Blood cultures taken. On Vanc and Zosyn. No longer afebrile. Patient denied recent IV drug use, but note from ED indicates patient admits to recent IV use. In pain, no other complaints.   Assessment/Plan: Principal Problem:   Sepsis Active Problems:   Cellulitis   IV drug abuse  Sepsis: - From cellulitis of right posterior thigh - On IV Vancomycin and Zosyn - WBC today 13.2; afebrile and non toxic looking  Right Posterior Thigh Cellulitis - Roughly 5 cm well-circumscribed, erythematous rash with central head - Known hx of IV drug use; possible etiology; patient denies IV drug use however  - IV Vancomycin and Zosyn - MRI pending- for evaluation of possible abscess  Hx of IV Drug Use: - Urine screen positive for benzodiazepines and Opiates - Patient states daily home Roxicodone use: Medication reconciliation ordered  Disposition: Remain inpatient  DVT Prophylaxis: SCD's  Code Status: Full code   Family Communication None at bedside  Procedures:  None  CONSULTS:  None  Time spent 40 minutes-which includes 50% of the time with face-to-face with patient/ family and coordinating care related to the above assessment and plan.    MEDICATIONS: Scheduled Meds: . piperacillin-tazobactam  3.375 g Intravenous Once  . piperacillin-tazobactam (ZOSYN)  IV  3.375 g Intravenous Q8H  . vancomycin  1,000 mg Intravenous Q8H   Continuous Infusions: . sodium chloride     PRN Meds:.acetaminophen, acetaminophen, HYDROmorphone (DILAUDID) injection, ondansetron (ZOFRAN) IV, ondansetron  Antibiotics: Anti-infectives   Start     Dose/Rate Route Frequency Ordered Stop   04/13/14 1200   vancomycin (VANCOCIN) IVPB 1000 mg/200 mL premix     1,000 mg 200 mL/hr over 60 Minutes Intravenous Every 8 hours 04/13/14 0550     04/13/14 1200  piperacillin-tazobactam (ZOSYN) IVPB 3.375 g     3.375 g 12.5 mL/hr over 240 Minutes Intravenous Every 8 hours 04/13/14 0550     04/13/14 0600  piperacillin-tazobactam (ZOSYN) IVPB 3.375 g     3.375 g 100 mL/hr over 30 Minutes Intravenous  Once 04/13/14 0550     04/13/14 0130  vancomycin (VANCOCIN) IVPB 1000 mg/200 mL premix     1,000 mg 200 mL/hr over 60 Minutes Intravenous  Once 04/13/14 0123 04/13/14 0403   04/13/14 0130  clindamycin (CLEOCIN) IVPB 600 mg     600 mg 100 mL/hr over 30 Minutes Intravenous  Once 04/13/14 0123 04/13/14 0217       PHYSICAL EXAM: Vital signs in last 24 hours: Filed Vitals:   04/13/14 0430 04/13/14 0500 04/13/14 0516 04/13/14 0551  BP: 110/46 114/68  104/62  Pulse: 86 92  88  Temp:   99 F (37.2 C) 98.5 F (36.9 C)  TempSrc:   Oral Oral  Resp:    16  Height:    6' (1.829 m)  Weight:    96.979 kg (213 lb 12.8 oz)  SpO2: 98% 95%  96%    Weight change:  Filed Weights   04/12/14 2310 04/13/14 0551  Weight: 97.155 kg (214 lb 3 oz) 96.979 kg (213 lb 12.8 oz)   Body mass index is 28.99 kg/(m^2).   Gen Exam: Awake and alert with clear speech.   Neck: Supple, No JVD.  Chest: B/L Clear.   CVS: S1 S2 Regular, no murmurs.  Abdomen: soft, BS +, non tender, non distended.  Extremities: Right posterior thigh with ~ 5 cm erythematous, well circumscribed rash with central head. Extremely tender to palpation. No exudate expressed. No edema, lower extremities warm to touch. Neurologic: Non Focal.  Skin: No Rash.   Wounds: N/A.    Intake/Output from previous day: No intake or output data in the 24 hours ending 04/13/14 1024   LAB RESULTS: CBC  Recent Labs Lab 04/13/14 0144  WBC 13.2*  HGB 15.2*  HCT 44.6  PLT 200  MCV 90.8  MCH 31.0  MCHC 34.1  RDW 14.4  LYMPHSABS 3.6  MONOABS 1.5*  EOSABS  0.1  BASOSABS 0.0    Chemistries   Recent Labs Lab 04/13/14 0144  NA 136*  K 3.8  CL 94*  CO2 26  GLUCOSE 82  BUN 11  CREATININE 0.56  CALCIUM 9.4    CBG: No results found for this basename: GLUCAP,  in the last 168 hours  GFR Estimated Creatinine Clearance: 134.2 ml/min (by C-G formula based on Cr of 0.56).  Coagulation profile No results found for this basename: INR, PROTIME,  in the last 168 hours  Cardiac Enzymes No results found for this basename: CK, CKMB, TROPONINI, MYOGLOBIN,  in the last 168 hours  No components found with this basename: POCBNP,  No results found for this basename: DDIMER,  in the last 72 hours No results found for this basename: HGBA1C,  in the last 72 hours No results found for this basename: CHOL, HDL, LDLCALC, TRIG, CHOLHDL, LDLDIRECT,  in the last 72 hours No results found for this basename: TSH, T4TOTAL, FREET3, T3FREE, THYROIDAB,  in the last 72 hours No results found for this basename: VITAMINB12, FOLATE, FERRITIN, TIBC, IRON, RETICCTPCT,  in the last 72 hours No results found for this basename: LIPASE, AMYLASE,  in the last 72 hours  Urine Studies No results found for this basename: UACOL, UAPR, USPG, UPH, UTP, UGL, UKET, UBIL, UHGB, UNIT, UROB, ULEU, UEPI, UWBC, URBC, UBAC, CAST, CRYS, UCOM, BILUA,  in the last 72 hours  MICROBIOLOGY: No results found for this or any previous visit (from the past 240 hour(s)).  RADIOLOGY STUDIES/RESULTS: No results found.  Meredeth IdeStorm, Joshua O, PA-S, [employer]   [facility] Pager:[phone]  If 7PM-7AM, please contact night-coverage [email or web address] Password [email or web address] 04/13/2014, 10:24 AM   LOS: 0 days   **Disclaimer: This note may have been dictated with voice recognition software. Similar sounding words can inadvertently be transcribed and this note may contain transcription errors which may not have been corrected upon publication of note.**  Attending Patient was seen,  examined,treatment plan was discussed with the Physician extender. I have directly reviewed the clinical findings, lab, imaging studies and management of this patient in detail. I have made the necessary changes to the above noted documentation, and agree with the documentation, as recorded by the Physician extender.  Windell NorfolkS Ghimire MD [facility].

## 2014-04-13 NOTE — H&P (Addendum)
[facility] History and Physical  Janice ChrisCandice Messerschmidt OZD:[id number] DOB: Aug 31, 1981 DOA: 04/13/2014  Referring physician: ER physician. PCP: No PCP Per Patient   Chief Complaint: Right thigh wound.  HPI: Janice Simmons is a 33 y.o. female with history of IV drug abuse presents to the ER because of worsening erythema and swelling in the right posterior thigh aspect. Patient states that the wound developed over the last few days with mild discharge. The ER patient was found to be tachycardic and febrile. On exam patient's right thigh posterior aspect has an erythematous area measuring around 5 cm with induration and tenderness the small area of discharge. Patient was given 1 L normal same bolus following which patient's heart rate improved and blood cultures were obtained following which antibiotics were started. Patient also was given tetanus toxoid. Patient states that she has recently taken IV drugs. Denies any chest pain or shortness of breath headache visual symptoms or any focal deficits.   Review of Systems: As presented in the history of presenting illness, rest negative.  Past Medical History  Diagnosis Date  . Depression   . Anxiety    Past Surgical History  Procedure Laterality Date  . Tonsillectomy     Social History:  reports that she has been smoking Cigarettes.  She has been smoking about 2.00 packs per day. She does not have any smokeless tobacco history on file. She reports that she drinks alcohol. She reports that she uses illicit drugs (Cocaine, IV, Benzodiazepines, Heroin, and Morphine). Where does patient live home. Can patient participate in ADLs? Yes.  No Known Allergies  Family History: History reviewed. No pertinent family history.    Prior to Admission medications   Not on File    Physical Exam: Filed Vitals:   04/12/14 2310 04/13/14 0300 04/13/14 0330 04/13/14 0415  BP: 144/97 85/57 118/63 128/61  Pulse: 117 100 98 97  Temp: 101.4 F (38.6 C)      TempSrc: Oral     Resp: 16  16   Height: 6' (1.829 m)     Weight: 97.155 kg (214 lb 3 oz)     SpO2: 95% 98% 95% 96%     General:  Well-developed well-nourished.  Eyes: Anicteric no pallor.  ENT: No discharge from ears eyes nose mouth.  Neck: No mass felt. No neck rigidity.  Cardiovascular: S1-S2 heard tachycardic.  Respiratory: No rhonchi or crepitations.  Abdomen: Soft nontender bowel sounds present no guarding rigidity.  Skin: Indurated erythematous area in the posterior thigh measuring about 5 cm with a small central punctum.  Musculoskeletal: As seen in the skin area.  Psychiatric: Appears normal.  Neurologic: Alert awake oriented to time place and person. Moves all extremities.  Labs on Admission:  Basic Metabolic Panel:  Recent Labs Lab 04/13/14 0144  NA 136*  K 3.8  CL 94*  CO2 26  GLUCOSE 82  BUN 11  CREATININE 0.56  CALCIUM 9.4   Liver Function Tests: No results found for this basename: AST, ALT, ALKPHOS, BILITOT, PROT, ALBUMIN,  in the last 168 hours No results found for this basename: LIPASE, AMYLASE,  in the last 168 hours No results found for this basename: AMMONIA,  in the last 168 hours CBC:  Recent Labs Lab 04/13/14 0144  WBC 13.2*  NEUTROABS 8.1*  HGB 15.2*  HCT 44.6  MCV 90.8  PLT 200   Cardiac Enzymes: No results found for this basename: CKTOTAL, CKMB, CKMBINDEX, TROPONINI,  in the last 168 hours  BNP (last  3 results) No results found for this basename: PROBNP,  in the last 8760 hours CBG: No results found for this basename: GLUCAP,  in the last 168 hours  Radiological Exams on Admission: No results found.    Assessment/Plan Principal Problem:   Sepsis Active Problems:   Cellulitis   IV drug abuse   1. Possible developing sepsis from cellulitis of the right thigh area - patient has been placed on vancomycin and Zosyn after blood cultures obtained. Tetanus toxoid has been administered in the ER. Ordered MRI right  femur to check for abscess. Follow blood cultures. 2. IV drug abuse - patient advised about quitting drug abuse. Social work consult. Check HIV. 3. Tobacco abuse - advised to quit.    Code Status: Full code.  Family Communication: None.  Disposition Plan: Admit to inpatient.    Ciel Yanes N. [facility] Pager [phone].  If 7PM-7AM, please contact night-coverage [email or web address] Password [email or web address] 04/13/2014, 4:40 AM

## 2014-04-13 NOTE — Progress Notes (Signed)
Utilization review completed.  

## 2014-04-16 NOTE — Discharge Summary (Signed)
PATIENT DETAILS Name: Janice Simmons Age: 33 y.o. Sex: female Date of Birth: 1981-01-10 MRN: [id number]. Admit Date: 04/13/2014 Admitting Physician: Eduard Clos, MD PCP:No PCP Per Patient  Patient left/eloped from the hospital  PRIMARY DISCHARGE DIAGNOSIS:  Principal Problem:   Sepsis Active Problems:   Cellulitis   IV drug abuse      PAST MEDICAL HISTORY: Past Medical History  Diagnosis Date  . Depression   . Anxiety   . Polysubstance abuse   . IVDU (intravenous drug user)   . Hepatitis     DISCHARGE MEDICATIONS:   Medication List    ASK your doctor about these medications       oxycodone 30 MG immediate release tablet  Commonly known as:  ROXICODONE  Take 30 mg by mouth 3 (three) times daily.        ALLERGIES:  No Known Allergies  BRIEF HPI:  See H&P, Labs, Consult and Test reports for all details in brief, patient was admitted for worsening erythema and swelling in the posterior aspect of the right thigh. She was thought to have cellulitis, admitted for further evaluation and treatment  CONSULTATIONS:   None  PERTINENT RADIOLOGIC STUDIES: No results found.   PERTINENT LAB RESULTS: CBC: No results found for this basename: WBC, HGB, HCT, PLT,  in the last 72 hours CMET CMP     Component Value Date/Time   NA 136* 04/13/2014 0144   K 3.8 04/13/2014 0144   CL 94* 04/13/2014 0144   CO2 26 04/13/2014 0144   GLUCOSE 82 04/13/2014 0144   BUN 11 04/13/2014 0144   CREATININE 0.56 04/13/2014 0144   CALCIUM 9.4 04/13/2014 0144   PROT 7.4 11/12/2013 1042   ALBUMIN 3.4* 11/12/2013 1042   AST 20 11/12/2013 1042   ALT 21 11/12/2013 1042   ALKPHOS 120* 11/12/2013 1042   BILITOT 0.7 11/12/2013 1042   GFRNONAA >90 04/13/2014 0144   GFRAA >90 04/13/2014 0144    GFR Estimated Creatinine Clearance: 134.2 ml/min (by C-G formula based on Cr of 0.56). No results found for this basename: LIPASE, AMYLASE,  in the last 72 hours No results found for this basename:  CKTOTAL, CKMB, CKMBINDEX, TROPONINI,  in the last 72 hours No components found with this basename: POCBNP,  No results found for this basename: DDIMER,  in the last 72 hours No results found for this basename: HGBA1C,  in the last 72 hours No results found for this basename: CHOL, HDL, LDLCALC, TRIG, CHOLHDL, LDLDIRECT,  in the last 72 hours No results found for this basename: TSH, T4TOTAL, FREET3, T3FREE, THYROIDAB,  in the last 72 hours No results found for this basename: VITAMINB12, FOLATE, FERRITIN, TIBC, IRON, RETICCTPCT,  in the last 72 hours Coags: No results found for this basename: PT, INR,  in the last 72 hours Microbiology: Recent Results (from the past 240 hour(s))  CULTURE, BLOOD (ROUTINE X 2)     Status: None   Collection Time    04/13/14  1:44 AM      Result Value Ref Range Status   Specimen Description BLOOD RIGHT HAND   Final   Special Requests BOTTLES DRAWN AEROBIC AND ANAEROBIC [facility] EACH   Final   Culture  Setup Time     Final   Value: 04/13/2014 08:32     Performed at [facility]   Culture     Final   Value:        BLOOD CULTURE RECEIVED NO GROWTH TO DATE  CULTURE WILL BE HELD FOR 5 DAYS BEFORE ISSUING A FINAL NEGATIVE REPORT     [facility]   Report Status PENDING   Incomplete  CULTURE, BLOOD (ROUTINE X 2)     Status: None   Collection Time    04/13/14  2:02 AM      Result Value Ref Range Status   Specimen Description BLOOD LEFT HAND   Final   Special Requests BOTTLES DRAWN AEROBIC AND ANAEROBIC [facility] EACH   Final   Culture  Setup Time     Final   Value: 04/13/2014 08:32     [facility]formed at [facility]   Culture     Final   Value:        BLOOD CULTURE RECEIVED NO GROWTH TO DATE CULTURE WILL BE HELD FOR 5 DAYS BEFORE ISSUING A FINAL NEGATIVE REPORT     [facility]   Report Status PENDING   Incomplete     BRIEF HOSPITAL COURSE:   Principal Problem:   Sepsis secondary to right thigh cellulitis -  Patient was admitted, started on IV vancomycin. On exam, there was a roughly 5 cm well circumscribed erythematous area in the posterior aspect of the right . Plans were for MRI of the area to rule out a deeper infection. Unfortunately, patient eloped from the unit without notifying the nursing staff/physician about the intent of leaving. - As a result, no further diagnostic testing, or no further discharge instructions medications could be provided to the patient. Per nursing report, patient's IV catheter, and IV pole was found the elevator on [address].  Active Problems: History of  IV drug abuse - During this admission, she denied any recent drug use, urine screen was positive for both benzodiazepines and opiates, patient claims that she is on chronic narcotics at home.  TODAY-DAY OF DISCHARGE:  Subjective:   Janice Simmons left a hospital without notifying staff.  Objective:   Blood pressure 107/71, pulse 86, temperature 98.8 F (37.1 C), temperature source Oral, resp. rate 16, height 6' (1.829 m), weight 96.979 kg (213 lb 12.8 oz), last menstrual period 03/29/2014, SpO2 98.00%. No intake or output data in the 24 hours ending 04/16/14 1817 Filed Weights   04/12/14 2310 04/13/14 0551  Weight: 97.155 kg (214 lb 3 oz) 96.979 kg (213 lb 12.8 oz)    Exam on day patient left hospital: Gen Exam: Awake and alert with clear speech.  Neck: Supple, No JVD.  Chest: B/L Clear.  CVS: S1 S2 Regular, no murmurs.  Abdomen: soft, BS +, non tender, non distended.  Extremities: Right posterior thigh with ~ 5 cm erythematous, well circumscribed rash with central head. Extremely tender to palpation. No exudate expressed. No edema, lower extremities warm to touch.  Neurologic: Non Focal.  Skin: No Rash.  Wounds: N/A.    DISCHARGE CONDITION: Stable  DISPOSITION: Eloped/LEFT the hospital without notifying staff       Follow-up Information   Follow up with No PCP Per Patient.   Specialty:  General  Practice     Signed: Jeoffrey MassedGHIMIRE,SHANKER 04/16/2014 6:17 PM  **Disclaimer: This note may have been dictated with voice recognition software. Similar sounding words can inadvertently be transcribed and this note may contain transcription errors which may not have been corrected upon publication of note.**

## 2014-04-19 LAB — CULTURE, BLOOD (ROUTINE X 2)
Culture: NO GROWTH
Culture: NO GROWTH

## 2016-02-14 ENCOUNTER — Inpatient Hospital Stay (HOSPITAL_COMMUNITY)
Admission: EM | Admit: 2016-02-14 | Discharge: 2016-02-19 | DRG: 917 | Disposition: A | Discharge status: Home / Self Care | Payer: Self-pay | Attending: Pulmonary Disease | Admitting: Pulmonary Disease

## 2016-02-14 ENCOUNTER — Emergency Department (HOSPITAL_COMMUNITY): Payer: Self-pay

## 2016-02-14 ENCOUNTER — Encounter (HOSPITAL_COMMUNITY): Payer: Self-pay | Admitting: Emergency Medicine

## 2016-02-14 DIAGNOSIS — F1190 Opioid use, unspecified, uncomplicated: Secondary | ICD-10-CM | POA: Diagnosis present

## 2016-02-14 DIAGNOSIS — F17210 Nicotine dependence, cigarettes, uncomplicated: Secondary | ICD-10-CM | POA: Diagnosis present

## 2016-02-14 DIAGNOSIS — E876 Hypokalemia: Secondary | ICD-10-CM | POA: Diagnosis not present

## 2016-02-14 DIAGNOSIS — D649 Anemia, unspecified: Secondary | ICD-10-CM | POA: Diagnosis present

## 2016-02-14 DIAGNOSIS — F418 Other specified anxiety disorders: Secondary | ICD-10-CM | POA: Diagnosis present

## 2016-02-14 DIAGNOSIS — T40601A Poisoning by unspecified narcotics, accidental (unintentional), initial encounter: Principal | ICD-10-CM | POA: Diagnosis present

## 2016-02-14 DIAGNOSIS — R0902 Hypoxemia: Secondary | ICD-10-CM

## 2016-02-14 DIAGNOSIS — E872 Acidosis: Secondary | ICD-10-CM | POA: Diagnosis present

## 2016-02-14 DIAGNOSIS — J690 Pneumonitis due to inhalation of food and vomit: Secondary | ICD-10-CM | POA: Diagnosis present

## 2016-02-14 DIAGNOSIS — N179 Acute kidney failure, unspecified: Secondary | ICD-10-CM | POA: Diagnosis present

## 2016-02-14 DIAGNOSIS — T17908A Unspecified foreign body in respiratory tract, part unspecified causing other injury, initial encounter: Secondary | ICD-10-CM

## 2016-02-14 DIAGNOSIS — K759 Inflammatory liver disease, unspecified: Secondary | ICD-10-CM | POA: Diagnosis present

## 2016-02-14 DIAGNOSIS — Z6841 Body Mass Index (BMI) 40.0 and over, adult: Secondary | ICD-10-CM

## 2016-02-14 DIAGNOSIS — T50901A Poisoning by unspecified drugs, medicaments and biological substances, accidental (unintentional), initial encounter: Secondary | ICD-10-CM | POA: Diagnosis present

## 2016-02-14 DIAGNOSIS — J9601 Acute respiratory failure with hypoxia: Secondary | ICD-10-CM | POA: Diagnosis present

## 2016-02-14 DIAGNOSIS — G9340 Encephalopathy, unspecified: Secondary | ICD-10-CM | POA: Diagnosis present

## 2016-02-14 LAB — BASIC METABOLIC PANEL
Anion gap: 18 — ABNORMAL HIGH (ref 5–15)
BUN: 6 mg/dL (ref 6–20)
CALCIUM: 9.2 mg/dL (ref 8.9–10.3)
CO2: 23 mmol/L (ref 22–32)
CREATININE: 1.54 mg/dL — AB (ref 0.44–1.00)
Chloride: 102 mmol/L (ref 101–111)
GFR calc Af Amer: 51 mL/min — ABNORMAL LOW (ref >60)
GFR calc non Af Amer: 44 mL/min — ABNORMAL LOW (ref >60)
GLUCOSE: 165 mg/dL — AB (ref 65–99)
Potassium: 3.5 mmol/L (ref 3.5–5.1)
Sodium: 143 mmol/L (ref 135–145)

## 2016-02-14 LAB — CBC
HEMATOCRIT: 51 % — AB (ref 36.0–46.0)
Hemoglobin: 15.5 g/dL — ABNORMAL HIGH (ref 12.0–15.0)
MCH: 30.1 pg (ref 26.0–34.0)
MCHC: 30.4 g/dL (ref 30.0–36.0)
MCV: 99 fL (ref 78.0–100.0)
Platelets: 293 10*3/uL (ref 150–400)
RBC: 5.15 MIL/uL — ABNORMAL HIGH (ref 3.87–5.11)
RDW: 14.3 % (ref 11.5–15.5)
WBC: 18.7 10*3/uL — ABNORMAL HIGH (ref 4.0–10.5)

## 2016-02-14 LAB — ACETAMINOPHEN LEVEL: Acetaminophen (Tylenol), Serum: <10 ug/mL — ABNORMAL LOW (ref 10–30)

## 2016-02-14 LAB — SALICYLATE LEVEL: Salicylate Lvl: <4 mg/dL (ref 2.8–30.0)

## 2016-02-14 LAB — ETHANOL: Alcohol, Ethyl (B): <5 mg/dL (ref <5)

## 2016-02-14 MED ORDER — AMPICILLIN-SULBACTAM SODIUM 3 (2-1) G IJ SOLR
3.0000 g | Freq: Once | INTRAMUSCULAR | Status: AC
  Administered 2016-02-15: 3 g via INTRAVENOUS
  Filled 2016-02-14 (×2): qty 3

## 2016-02-14 MED ORDER — ONDANSETRON HCL 4 MG/2ML IJ SOLN
4.0000 mg | Freq: Once | INTRAMUSCULAR | Status: AC
  Administered 2016-02-14: 4 mg via INTRAVENOUS

## 2016-02-14 MED ORDER — NALOXONE HCL 2 MG/2ML IJ SOSY
1.0000 mg/h | PREFILLED_SYRINGE | INTRAMUSCULAR | Status: DC
  Administered 2016-02-14 – 2016-02-15 (×3): 1 mg/h via INTRAVENOUS
  Filled 2016-02-14 (×4): qty 4

## 2016-02-14 MED ORDER — NALOXONE HCL 2 MG/2ML IJ SOSY
1.0000 mg | PREFILLED_SYRINGE | Freq: Once | INTRAMUSCULAR | Status: DC
  Filled 2016-02-14: qty 2

## 2016-02-14 MED ORDER — NALOXONE HCL 2 MG/2ML IJ SOSY
PREFILLED_SYRINGE | INTRAMUSCULAR | Status: AC
  Administered 2016-02-14: 2 mg
  Filled 2016-02-14: qty 2

## 2016-02-14 MED ORDER — ONDANSETRON HCL 4 MG/2ML IJ SOLN
INTRAMUSCULAR | Status: AC
  Filled 2016-02-14: qty 2

## 2016-02-14 NOTE — ED Notes (Signed)
Called pharmacy for narcan drip to be prepared.

## 2016-02-14 NOTE — ED Notes (Signed)
Called Dr. Karma GanjaLinker, repeat dose for 1mg  of narcan. MD at the bedside.

## 2016-02-14 NOTE — ED Provider Notes (Signed)
CSN: [id number]     Arrival date & time 02/14/16  2120 History   First MD Initiated Contact with Patient 02/14/16 2128     Chief Complaint  Patient presents with  . Drug Overdose     (Consider location/radiation/quality/duration/timing/severity/associated sxs/prior Treatment) HPI  A LEVEL 5 CAVEAT PERTAINS DUE TO ALTERED MENTAL STATUS/URGENT NEED FOR INTERVENTION.  Pt presenting after being found unconscious with shallow respirations.  Her significant other states she went to the methadone clinic earlier in the day.  EMS arrived and gave narcan, she had some response with vomiting.  Upon arrival she is very somnolent, on a non rebreather  Past Medical History  Diagnosis Date  . Depression   . Anxiety   . Polysubstance abuse   . IVDU (intravenous drug user)   . Hepatitis    Past Surgical History  Procedure Laterality Date  . Tonsillectomy     History reviewed. No pertinent family history. Social History  Substance Use Topics  . Smoking status: Current Every Day Smoker -- 2.00 packs/day    Types: Cigarettes  . Smokeless tobacco: None  . Alcohol Use: Yes     Comment: pt reports binge drinking    OB History    No data available     Review of Systems  ROS reviewed and all otherwise negative except for mentioned in HPI    Allergies  Review of patient's allergies indicates no known allergies.  Home Medications   Prior to Admission medications   Medication Sig Start Date End Date Taking? Authorizing Provider  oxycodone (ROXICODONE) 30 MG immediate release tablet Take 30 mg by mouth 3 (three) times daily.    Historical Provider, MD   BP 94/48 mmHg  Pulse 92  Temp(Src) 99.2 F (37.3 C) (Oral)  Resp 20  Ht 6' (1.829 m)  Wt 308 lb 10.3 oz (140 kg)  BMI 41.85 kg/m2  SpO2 90%  LMP  (LMP Unknown)  Vitals reviewed Physical Exam  Physical Examination: General appearance - sleeping, ill appearing Mental status - alert, oriented to person, place, and time Eyes - pupils  pinpoint bilaterally, then dilated after narcan administration Mouth - mucous membranes moist, pharynx normal without lesions Chest - clear to auscultation, no wheezes, rales or rhonchi, symmetric air entry Heart - normal rate, regular rhythm, normal S1, S2, no murmurs, rubs, clicks or gallops Abdomen - soft, nontender, nondistended, no masses or organomegaly Neurological - somnolent but arousable to voice, slurred speech, moving all extremities Extremities - peripheral pulses normal, no pedal edema, no clubbing or cyanosis Skin - normal coloration and turgor, no rashes,   ED Course  Procedures (including critical care time)  CRITICAL CARE Performed by: Ethelda Chick Total critical care time: 75 minutes Critical care time was exclusive of separately billable procedures and treating other patients. Critical care was necessary to treat or prevent imminent or life-threatening deterioration. Critical care was time spent personally by me on the following activities: development of treatment plan with patient and/or surrogate as well as nursing, discussions with consultants, evaluation of patient's response to treatment, examination of patient, obtaining history from patient or surrogate, ordering and performing treatments and interventions, ordering and review of laboratory studies, ordering and review of radiographic studies, pulse oximetry and re-evaluation of patient's condition. Labs Review Labs Reviewed  CBC - Abnormal; Notable for the following:    WBC 18.7 (*)    RBC 5.15 (*)    Hemoglobin 15.5 (*)    HCT 51.0 (*)    All  other components within normal limits  BASIC METABOLIC PANEL - Abnormal; Notable for the following:    Glucose, Bld 165 (*)    Creatinine, Ser 1.54 (*)    GFR calc non Af Amer 44 (*)    GFR calc Af Amer 51 (*)    Anion gap 18 (*)    All other components within normal limits  ACETAMINOPHEN LEVEL - Abnormal; Notable for the following:    Acetaminophen (Tylenol),  Serum <10 (*)    All other components within normal limits  URINE RAPID DRUG SCREEN, HOSP PERFORMED - Abnormal; Notable for the following:    Benzodiazepines POSITIVE (*)    All other components within normal limits  URINALYSIS, ROUTINE W REFLEX MICROSCOPIC (NOT AT [facility]) - Abnormal; Notable for the following:    Color, Urine AMBER (*)    APPearance CLOUDY (*)    Glucose, UA 100 (*)    Hgb urine dipstick TRACE (*)    Bilirubin Urine SMALL (*)    Protein, ur 100 (*)    Leukocytes, UA TRACE (*)    All other components within normal limits  URINE MICROSCOPIC-ADD ON - Abnormal; Notable for the following:    Squamous Epithelial / LPF 6-30 (*)    Bacteria, UA MANY (*)    Casts GRANULAR CAST (*)    All other components within normal limits  CBC - Abnormal; Notable for the following:    WBC 12.9 (*)    All other components within normal limits  BASIC METABOLIC PANEL - Abnormal; Notable for the following:    Glucose, Bld 130 (*)    Creatinine, Ser 1.08 (*)    All other components within normal limits  TROPONIN I - Abnormal; Notable for the following:    Troponin I 1.22 (*)    All other components within normal limits  LACTIC ACID, PLASMA - Abnormal; Notable for the following:    Lactic Acid, Venous 2.6 (*)    All other components within normal limits  POCT I-STAT 3, ART BLOOD GAS (G3+) - Abnormal; Notable for the following:    pCO2 arterial 52.7 (*)    pO2, Arterial 70.0 (*)    Bicarbonate 29.8 (*)    Acid-Base Excess 4.0 (*)    All other components within normal limits  MRSA PCR SCREENING  CULTURE, BLOOD (ROUTINE X 2)  CULTURE, BLOOD (ROUTINE X 2)  CULTURE, EXPECTORATED SPUTUM-ASSESSMENT  SALICYLATE LEVEL  ETHANOL  PREGNANCY, URINE  MAGNESIUM  PHOSPHORUS    Imaging Review Dg Chest Portable 1 View  02/14/2016  CLINICAL DATA:  Cough and vomiting. EXAM: PORTABLE CHEST 1 VIEW COMPARISON:  None. FINDINGS: Lung volumes are low. Patchy opacity at the right lung base. Minimal  retrocardiac atelectasis at the left lung base. Heart size is normal for technique. No pulmonary edema. No large pleural effusion. No pneumothorax. Osseous structures grossly intact. IMPRESSION: Patchy right basilar opacity. Retrocardiac left lung base atelectasis. Findings can be seen with aspiration. Electronically Signed   By: Rubye OaksMelanie  Ehinger M.D.   On: 02/14/2016 22:10   I have personally reviewed and evaluated these images and lab results as part of my medical decision-making.   EKG Interpretation   Date/Time:  Tuesday Feb 14 2016 21:29:57 EDT Ventricular Rate:  115 PR Interval:  175 QRS Duration: 100 QT Interval:  349 QTC Calculation: 483 R Axis:   93 Text Interpretation:  Sinus tachycardia Multiple ventricular premature  complexes Borderline right axis deviation Probable anteroseptal infarct,  old Borderline T abnormalities, inferior  leads No old tracing to compare  Confirmed by [facility]  MD, Deanda Ruddell [phone]) on 02/14/2016 10:10:16 PM      MDM   Final diagnoses:  Narcotic overdose, accidental or unintentional, initial encounter   Pt presenting after presumed narcotic overdose- pt responded to narcan- placed on narcan drip as she became somnolent between doses of narcan.  Pt had an episode of emesis with EMS and in the ED.  CXR appears c/w aspiration.  Pt receiving IV fluids and started on IV unasyn as well.  PCCM consulted and will see patient for admsision.  Pt with low grade fever and leukocytosis- skin checked for infection without finding any signficant source.     11:57 PM d/w PCCM - they will see patient in the ED for admission.    Jerelyn Scott, MD 02/15/16 [phone]

## 2016-02-14 NOTE — ED Notes (Signed)
Phlebotomy and portable xray at the bedside.

## 2016-02-14 NOTE — ED Notes (Signed)
Found unresponsive with shallow respirations in bed by significant other. 0.5 of narcan that increased resprations, and patient still non responsive, vomited enroute. Responsive with stimulus. Admits to heroine use, and was at pain clinic today.

## 2016-02-14 NOTE — ED Notes (Signed)
Called pharmacy about unasyn

## 2016-02-15 DIAGNOSIS — J9601 Acute respiratory failure with hypoxia: Secondary | ICD-10-CM

## 2016-02-15 DIAGNOSIS — F1910 Other psychoactive substance abuse, uncomplicated: Secondary | ICD-10-CM | POA: Insufficient documentation

## 2016-02-15 DIAGNOSIS — F329 Major depressive disorder, single episode, unspecified: Secondary | ICD-10-CM | POA: Insufficient documentation

## 2016-02-15 DIAGNOSIS — F32A Depression, unspecified: Secondary | ICD-10-CM | POA: Insufficient documentation

## 2016-02-15 DIAGNOSIS — G9340 Encephalopathy, unspecified: Secondary | ICD-10-CM | POA: Insufficient documentation

## 2016-02-15 DIAGNOSIS — T40601A Poisoning by unspecified narcotics, accidental (unintentional), initial encounter: Secondary | ICD-10-CM | POA: Insufficient documentation

## 2016-02-15 DIAGNOSIS — T50901A Poisoning by unspecified drugs, medicaments and biological substances, accidental (unintentional), initial encounter: Secondary | ICD-10-CM | POA: Diagnosis present

## 2016-02-15 DIAGNOSIS — J690 Pneumonitis due to inhalation of food and vomit: Secondary | ICD-10-CM | POA: Insufficient documentation

## 2016-02-15 DIAGNOSIS — F419 Anxiety disorder, unspecified: Secondary | ICD-10-CM | POA: Insufficient documentation

## 2016-02-15 LAB — URINALYSIS, ROUTINE W REFLEX MICROSCOPIC
Glucose, UA: 100 mg/dL — AB
KETONES UR: NEGATIVE mg/dL
Nitrite: NEGATIVE
Protein, ur: 100 mg/dL — AB
Specific Gravity, Urine: 1.019 (ref 1.005–1.030)
pH: 5.5 (ref 5.0–8.0)

## 2016-02-15 LAB — URINE MICROSCOPIC-ADD ON

## 2016-02-15 LAB — BASIC METABOLIC PANEL
ANION GAP: 9 (ref 5–15)
BUN: 7 mg/dL (ref 6–20)
CO2: 29 mmol/L (ref 22–32)
Calcium: 8.9 mg/dL (ref 8.9–10.3)
Chloride: 101 mmol/L (ref 101–111)
Creatinine, Ser: 1.08 mg/dL — ABNORMAL HIGH (ref 0.44–1.00)
GFR calc Af Amer: >60 mL/min (ref >60)
GFR calc non Af Amer: >60 mL/min (ref >60)
Glucose, Bld: 130 mg/dL — ABNORMAL HIGH (ref 65–99)
POTASSIUM: 4.4 mmol/L (ref 3.5–5.1)
SODIUM: 139 mmol/L (ref 135–145)

## 2016-02-15 LAB — CBC
HCT: 45.7 % (ref 36.0–46.0)
Hemoglobin: 14.3 g/dL (ref 12.0–15.0)
MCH: 29.4 pg (ref 26.0–34.0)
MCHC: 31.3 g/dL (ref 30.0–36.0)
MCV: 93.8 fL (ref 78.0–100.0)
PLATELETS: 239 10*3/uL (ref 150–400)
RBC: 4.87 MIL/uL (ref 3.87–5.11)
RDW: 14.2 % (ref 11.5–15.5)
WBC: 12.9 10*3/uL — AB (ref 4.0–10.5)

## 2016-02-15 LAB — RAPID URINE DRUG SCREEN, HOSP PERFORMED
AMPHETAMINES: NOT DETECTED
BENZODIAZEPINES: POSITIVE — AB
Barbiturates: NOT DETECTED
Cocaine: NOT DETECTED
Opiates: NOT DETECTED
Tetrahydrocannabinol: NOT DETECTED

## 2016-02-15 LAB — POCT I-STAT 3, ART BLOOD GAS (G3+)
ACID-BASE EXCESS: 4 mmol/L — AB (ref 0.0–2.0)
Bicarbonate: 29.8 mEq/L — ABNORMAL HIGH (ref 20.0–24.0)
O2 Saturation: 92 %
PH ART: 7.365 (ref 7.350–7.450)
PO2 ART: 70 mmHg — AB (ref 80.0–100.0)
TCO2: 31 mmol/L (ref 0–100)
pCO2 arterial: 52.7 mmHg — ABNORMAL HIGH (ref 35.0–45.0)

## 2016-02-15 LAB — LACTIC ACID, PLASMA: LACTIC ACID, VENOUS: 2.6 mmol/L — AB (ref 0.5–2.0)

## 2016-02-15 LAB — MRSA PCR SCREENING: MRSA by PCR: NEGATIVE

## 2016-02-15 LAB — PREGNANCY, URINE: Preg Test, Ur: NEGATIVE

## 2016-02-15 LAB — PHOSPHORUS: Phosphorus: 3.2 mg/dL (ref 2.5–4.6)

## 2016-02-15 LAB — MAGNESIUM: Magnesium: 1.8 mg/dL (ref 1.7–2.4)

## 2016-02-15 LAB — TROPONIN I: Troponin I: 1.22 ng/mL (ref <0.031)

## 2016-02-15 MED ORDER — SODIUM CHLORIDE 0.9 % IV SOLN: 250.0000 mL | INTRAVENOUS | Status: DC | PRN

## 2016-02-15 MED ORDER — HEPARIN SODIUM (PORCINE) 5000 UNIT/ML IJ SOLN
5000.0000 [IU] | Freq: Three times a day (TID) | INTRAMUSCULAR | Status: DC
  Administered 2016-02-15 – 2016-02-18 (×7): 5000 [IU] via SUBCUTANEOUS
  Filled 2016-02-15 (×14): qty 1

## 2016-02-15 MED ORDER — SODIUM CHLORIDE 0.9 % IV SOLN
INTRAVENOUS | Status: DC
Start: 2016-02-15 — End: 2016-02-18
  Administered 2016-02-15: 02:00:00 via INTRAVENOUS
  Administered 2016-02-16: 75 mL/h via INTRAVENOUS
  Administered 2016-02-17: 02:00:00 via INTRAVENOUS
  Administered 2016-02-18: 75 mL/h via INTRAVENOUS

## 2016-02-15 MED ORDER — SODIUM CHLORIDE 0.9 % IV SOLN
3.0000 g | Freq: Four times a day (QID) | INTRAVENOUS | Status: DC
  Administered 2016-02-15 – 2016-02-19 (×17): 3 g via INTRAVENOUS
  Filled 2016-02-15 (×21): qty 3

## 2016-02-15 MED ORDER — SODIUM CHLORIDE 0.9 % IV SOLN
3.0000 g | Freq: Three times a day (TID) | INTRAVENOUS | Status: DC
  Administered 2016-02-15: 3 g via INTRAVENOUS
  Filled 2016-02-15 (×3): qty 3

## 2016-02-15 MED ORDER — SODIUM CHLORIDE 0.9 % IV SOLN
Freq: Once | INTRAVENOUS | Status: AC
  Administered 2016-02-15: 02:00:00 via INTRAVENOUS

## 2016-02-15 MED ORDER — CETYLPYRIDINIUM CHLORIDE 0.05 % MT LIQD
7.0000 mL | Freq: Two times a day (BID) | OROMUCOSAL | Status: DC
  Administered 2016-02-15 – 2016-02-16 (×4): 7 mL via OROMUCOSAL

## 2016-02-15 NOTE — Care Management Note (Signed)
Case Management Note  Patient Details  Name: Rennis ChrisCandice Stiger MRN: [id number] Date of Birth: [birth year]  Subjective/Objective:            Adm w overdose, poss asp pneumonia        Action/Plan: lives at home, goes to methadone clinic, will make sw ref   Expected Discharge Date:                  Expected Discharge Plan:  Home/Self Care  In-House Referral:  Clinical Social Work  Discharge planning Services     Post Acute Care Choice:    Choice offered to:     DME Arranged:    DME Agency:     HH Arranged:    HH Agency:     Status of Service:     Medicare Important Message Given:    Date Medicare IM Given:    Medicare IM give by:    Date Additional Medicare IM Given:    Additional Medicare Important Message give by:     If discussed at Long Length of Stay Meetings, dates discussed:    Additional Comments: ur review done  Hanley HaysDowell, Dex Blakely T, RN 02/15/2016, 8:52 AM

## 2016-02-15 NOTE — Progress Notes (Signed)
Pharmacy Antibiotic Note  Janice Simmons is a 35 y.o. female admitted on 02/14/2016 with pneumonia.  Pharmacy has been consulted for Unasyn dosing. Pt was found unresponsive secondary to heroine use.  Plan: Unasyn 3g IV q8h  Monitor culture data, renal function and clinical course  Height: 6' (182.9 cm) Weight: 220 lb (99.791 kg) IBW/kg (Calculated) : 73.1  Temp (24hrs), Avg:100.1 F (37.8 C), Min:100.1 F (37.8 C), Max:100.1 F (37.8 C)   Recent Labs Lab 02/14/16 2215  WBC 18.7*  CREATININE 1.54*    Estimated Creatinine Clearance: 69.4 mL/min (by C-G formula based on Cr of 1.54).    No Known Allergies  Antimicrobials this admission: Unasyn 5/24 >>   Dose adjustments this admission: n/a  Microbiology results:  BCx:   UCx:    Sputum:    MRSA PCR:    Arlean Hoppingorey M. Newman PiesBall, PharmD, BCPS Clinical Pharmacist Pager [phone] 02/15/2016 12:24 AM

## 2016-02-15 NOTE — Progress Notes (Addendum)
CRITICAL VALUE ALERT  Critical value received:  Lactic 2.6, trop 1.22  Date of notification:  02/15/2016  Time of notification:  0205  Critical value read back:Yes.    Nurse who received alert:  Luan Mooreharis RN  MD notified (1st page):  Pola CornELINK MD  Time of first page:  0210  Responding MD:  Pola CornELINK MD  Time MD responded:  [phone]

## 2016-02-15 NOTE — ED Notes (Signed)
MD at the bedside  

## 2016-02-15 NOTE — ED Notes (Signed)
Called rea, pregnancy test in urine to be added on.

## 2016-02-15 NOTE — Progress Notes (Signed)
Attempted to give report to 5w. Nurse not available at this time. Will check back shortly

## 2016-02-15 NOTE — ED Notes (Signed)
IV team at the bedside. 

## 2016-02-15 NOTE — H&P (Signed)
PULMONARY / CRITICAL CARE MEDICINE   Name: Mattilyn Crites MRN: [id number] DOB: 05/06/1981    ADMISSION DATE:  02/14/2016 CONSULTATION DATE:  02/14/16  REFERRING MD:  EDP  CHIEF COMPLAINT:  AMS  HISTORY OF PRESENT ILLNESS:  Pt is encephelopathic; therefore, this HPI is obtained from chart review. Chika Daye is a 35 y.o. female with PMH as outlined below including polysubstance abuse and IVDU.  She was brought to [facility] ED 05/23 due to AMS and shallow respirations.  Her significant other states he took her to methadone clinic earlier that day and pt was fine upon returning home.  She later became very sleepy and got worse into the evening to the point that he was unable to wake her up.  Due to her breathing and AMS, he decided to call EMS.  EMS administered 0.5mg  narcan with some improvement.  Pt then had vomiting episode so was placed on NRB during transport.  In ED, she was started on narcan gtt and PCCM was called for admission.  UDS is pending.  Pt has hx of heroin use.  Significant other states that she told him she used heroine "a few days ago".  When pt directly questioned, she states "I do not know god damnit".  Of note, she had admission in 2015 and eloped at that time.  PAST MEDICAL HISTORY :  She  has a past medical history of Depression; Anxiety; Polysubstance abuse; IVDU (intravenous drug user); and Hepatitis.  PAST SURGICAL HISTORY: She  has past surgical history that includes Tonsillectomy.  No Known Allergies  No current facility-administered medications on file prior to encounter.   Current Outpatient Prescriptions on File Prior to Encounter  Medication Sig  . oxycodone (ROXICODONE) 30 MG immediate release tablet Take 30 mg by mouth 3 (three) times daily.    FAMILY HISTORY:  Her has no family status information on file.   SOCIAL HISTORY: She  reports that she has been smoking Cigarettes.  She has been smoking about 2.00 packs per day. She does not have any  smokeless tobacco history on file. She reports that she drinks alcohol. She reports that she uses illicit drugs (Cocaine, IV, Benzodiazepines, Heroin, and Morphine).  REVIEW OF SYSTEMS:   Unable to admit as pt is encephalopathic.  SUBJECTIVE:  Opens eyes to noxious stimuli.  Withdraws to pain.  Answers basic questions.  VITAL SIGNS: BP 101/51 mmHg  Pulse 99  Temp(Src) 100.1 F (37.8 C) (Oral)  Resp 26  Ht 6' (1.829 m)  Wt 220 lb (99.791 kg)  BMI 29.83 kg/m2  SpO2 93%  LMP  (LMP Unknown)  HEMODYNAMICS:    VENTILATOR SETTINGS:    INTAKE / OUTPUT:     PHYSICAL EXAMINATION: General: Middle aged female, in NAD. Neuro: Somnolent but easily arouseable to voice. HEENT: [place]/AT. PERRL, sclerae anicteric. Cardiovascular: RRR, no M/R/G.  Lungs: Respirations even and unlabored.  CTA bilaterally, No W/R/R. Abdomen: Obese, BS x 4, soft, NT/ND.  Musculoskeletal: No gross deformities, no edema.  Skin: Intact, warm, no rashes.  LABS:  BMET  Recent Labs Lab 02/14/16 2215  NA 143  K 3.5  CL 102  CO2 23  BUN 6  CREATININE 1.54*  GLUCOSE 165*    Electrolytes  Recent Labs Lab 02/14/16 2215  CALCIUM 9.2    CBC  Recent Labs Lab 02/14/16 2215  WBC 18.7*  HGB 15.5*  HCT 51.0*  PLT 293    Coag's No results for input(s): APTT, INR in the last 168 hours.  Sepsis Markers No results for input(s): LATICACIDVEN, PROCALCITON, O2SATVEN in the last 168 hours.  ABG No results for input(s): PHART, PCO2ART, PO2ART in the last 168 hours.  Liver Enzymes No results for input(s): AST, ALT, ALKPHOS, BILITOT, ALBUMIN in the last 168 hours.  Cardiac Enzymes No results for input(s): TROPONINI, PROBNP in the last 168 hours.  Glucose No results for input(s): GLUCAP in the last 168 hours.  Imaging Dg Chest Portable 1 View  02/14/2016  CLINICAL DATA:  Cough and vomiting. EXAM: PORTABLE CHEST 1 VIEW COMPARISON:  None. FINDINGS: Lung volumes are low. Patchy opacity at the right  lung base. Minimal retrocardiac atelectasis at the left lung base. Heart size is normal for technique. No pulmonary edema. No large pleural effusion. No pneumothorax. Osseous structures grossly intact. IMPRESSION: Patchy right basilar opacity. Retrocardiac left lung base atelectasis. Findings can be seen with aspiration. Electronically Signed   By: Rubye Oaks M.D.   On: 02/14/2016 22:10     STUDIES:  CXR 05/23 > right basilar opacity.  CULTURES: Blood 05/24 > Sputum 05/24 >  ANTIBIOTICS: Unasyn 05/23 >  SIGNIFICANT EVENTS: 05/24 > admitted with presumed overdose and probable aspiration PNA.  LINES/TUBES: None.  DISCUSSION: 35 y.o. F with hx polysubstance abuse and hx of previous OD, admitted 05/24 with presumed OD and probable aspiration PNA.  ASSESSMENT / PLAN:  NEUROLOGIC A:   Acute encephalopathy - presumed due to OD. She goes to methadone clinic and uses heroine recreationally as well.  Hx depression, anxiety, polysubstance abuse, IVDU, elopement last admission. P: Continue narcan gtt. Monitor closely. Flight risk / elopement precautions. Polysubstance abuse counseling.   PULMONARY A: Acute hypoxic respiratory failure - likely due to aspiration + hypoventilation. Probable aspiration PNA - had vomiting episode after narcan administered. At risk for intubation. P:   Continue supplemental O2 as needed to maintain SpO2 > 92%. Abx / cultures per ID section. Pulmonary hygiene. CXR in AM. Check ABG. Holding off on bipap 2/2 vomiting.   CARDIOVASCULAR A:  No acute issues. P:  Monitor hemodynamics. Assess troponin, lactate.  RENAL A:   AKI. P:   Will hydrate with 500 ml- 1L bolus. She looks dry.  NS @ 75. BMP in AM.  GASTROINTESTINAL A:   Obesity. Nutrition. P:   NPO for now 2/2 AMS.   HEMATOLOGIC A:   VTE Prophylaxis. P:  SCD's / heparin. CBC in AM.  INFECTIOUS A:   Probable aspiration PNA - had vomiting episode after narcan  administered. P:   Abx as above (unasyn).  Follow cultures as above.  ENDOCRINE A:   No acute issues.   P:   Monitor glucose on BMP.  Family updated: Significant other updated at bedside.  Interdisciplinary Family Meeting v Palliative Care Meeting:  Due by: 05/30.  CC time: 30 minutes.   Rutherford Guys, [place] [facility] Pager: [phone]  or [phone] 02/15/2016, 12:22 AM   ATTENDING NOTE / ATTESTATION NOTE :   I have discussed the case with the resident/APP Rahul Desai.  I agree with the resident/APP's  history, physical examination, assessment, and plans.  I have edited the above note and modified it according to our agreed history, physical examination, assessment and plan.   Briefly, pt on chronic methadone who also uses heroine, brought in for AMS and bradypnea. She got  better some from narcan but had vomiting. Started on narcan drip and is slowly waking up. When  I saw her at 1:30am, she was conversant for the most part and protecting her airway  but was still drowsy and confused.  BP stable, tachycardic, low grade fever. (+) Bibasilar crackles.   CXR with possible asp pna.  Elevated Creat prob 2/2 volume depletion.  Cont narcan drip for now.  Cont Unasyn to cover asp pna.  Being admitted to  ICU. Reevaluate in am, potentially transfer to SDU if she is better. On the other hand, may also get drowsier requiring intubation.   I have spent 31 minutes of critical care time with this patient today.  Family :Friend who called EMS was updated.    Pollie MeyerJ. Angelo A de Dios, MD 02/15/2016, 1:34 AM [facility] Pager [phone] After 3 pm or if no answer, call [phone]

## 2016-02-15 NOTE — ED Notes (Signed)
Called pharmacy to have narcan drip sent to floor.

## 2016-02-15 NOTE — ED Notes (Signed)
IV team still at the bedside.  

## 2016-02-16 ENCOUNTER — Inpatient Hospital Stay (HOSPITAL_COMMUNITY): Payer: MEDICAID

## 2016-02-16 ENCOUNTER — Telehealth (HOSPITAL_BASED_OUTPATIENT_CLINIC_OR_DEPARTMENT_OTHER): Payer: Self-pay | Admitting: Emergency Medicine

## 2016-02-16 ENCOUNTER — Inpatient Hospital Stay (HOSPITAL_COMMUNITY): Payer: Self-pay

## 2016-02-16 DIAGNOSIS — T50901S Poisoning by unspecified drugs, medicaments and biological substances, accidental (unintentional), sequela: Secondary | ICD-10-CM

## 2016-02-16 DIAGNOSIS — J690 Pneumonitis due to inhalation of food and vomit: Secondary | ICD-10-CM

## 2016-02-16 LAB — BLOOD GAS, ARTERIAL
ACID-BASE EXCESS: 7.8 mmol/L — AB (ref 0.0–2.0)
Bicarbonate: 32.6 mEq/L — ABNORMAL HIGH (ref 20.0–24.0)
DRAWN BY: 305991
FIO2: 31
O2 Saturation: 88.3 %
PCO2 ART: 52.4 mmHg — AB (ref 35.0–45.0)
Patient temperature: 98.4
TCO2: 34.2 mmol/L (ref 0–100)
pH, Arterial: 7.41 (ref 7.350–7.450)
pO2, Arterial: 57 mmHg — ABNORMAL LOW (ref 80.0–100.0)

## 2016-02-16 LAB — GLUCOSE, CAPILLARY
GLUCOSE-CAPILLARY: 151 mg/dL — AB (ref 65–99)
Glucose-Capillary: 147 mg/dL — ABNORMAL HIGH (ref 65–99)
Glucose-Capillary: 139 mg/dL — ABNORMAL HIGH (ref 65–99)

## 2016-02-16 LAB — BASIC METABOLIC PANEL
Anion gap: 7 (ref 5–15)
BUN: 15 mg/dL (ref 6–20)
CO2: 33 mmol/L — ABNORMAL HIGH (ref 22–32)
CREATININE: 0.9 mg/dL (ref 0.44–1.00)
Calcium: 8.4 mg/dL — ABNORMAL LOW (ref 8.9–10.3)
Chloride: 100 mmol/L — ABNORMAL LOW (ref 101–111)
GFR calc non Af Amer: >60 mL/min (ref >60)
Glucose, Bld: 134 mg/dL — ABNORMAL HIGH (ref 65–99)
POTASSIUM: 4.6 mmol/L (ref 3.5–5.1)
SODIUM: 140 mmol/L (ref 135–145)

## 2016-02-16 LAB — RAPID URINE DRUG SCREEN, HOSP PERFORMED
Amphetamines: NOT DETECTED
Barbiturates: NOT DETECTED
Benzodiazepines: POSITIVE — AB
Cocaine: NOT DETECTED
Opiates: NOT DETECTED
Tetrahydrocannabinol: NOT DETECTED

## 2016-02-16 MED ORDER — NALOXONE HCL 0.4 MG/ML IJ SOLN
0.4000 mg | INTRAMUSCULAR | Status: DC | PRN
  Administered 2016-02-16 (×2): 0.4 mg via INTRAVENOUS
  Filled 2016-02-16 (×2): qty 1

## 2016-02-16 MED ORDER — ONDANSETRON HCL 4 MG/2ML IJ SOLN
4.0000 mg | Freq: Four times a day (QID) | INTRAMUSCULAR | Status: DC | PRN
  Administered 2016-02-16 – 2016-02-17 (×2): 4 mg via INTRAVENOUS
  Filled 2016-02-16 (×2): qty 2

## 2016-02-16 MED ORDER — NALOXONE HCL 2 MG/2ML IJ SOSY
0.2500 mg/h | PREFILLED_SYRINGE | INTRAVENOUS | Status: AC
  Administered 2016-02-16 – 2016-02-17 (×3): 0.25 mg/h via INTRAVENOUS
  Filled 2016-02-16 (×3): qty 4

## 2016-02-16 MED ORDER — NALOXONE HCL 0.4 MG/ML IJ SOLN
INTRAMUSCULAR | Status: AC
  Filled 2016-02-16: qty 1

## 2016-02-16 MED ORDER — NALOXONE HCL 0.4 MG/ML IJ SOLN
INTRAMUSCULAR | Status: AC
  Administered 2016-02-16: 0.4 mg via INTRAVENOUS
  Filled 2016-02-16: qty 2

## 2016-02-16 MED ORDER — NALOXONE HCL 0.4 MG/ML IJ SOLN
0.4000 mg | INTRAMUSCULAR | Status: DC | PRN
  Administered 2016-02-16: 0.4 mg via INTRAVENOUS

## 2016-02-16 MED ORDER — NALOXONE HCL 2 MG/2ML IJ SOSY
0.2500 mg/h | PREFILLED_SYRINGE | INTRAVENOUS | Status: DC
  Filled 2016-02-16: qty 4

## 2016-02-16 NOTE — Progress Notes (Signed)
See prior documentation Arrived on floor this am ~ 0740  Patient c pin-point pupils, poorly responsive 02 sat 85% Refusing NRB  Narcan given- becamemore awake, belligerent Refusing to answer q's BF at bedside doesn't know her doses of duration of methadone use.  Contacted CCM Dr. Glean SalvoYacoub/Ms Ollis and eventual decision made to Tx patient back to ICU level care as needing NRB persistently and refusing nasal canula  Appreciate CCM input Am personally available if PRN through 5/30 to assume care of this patient-please let me know  Pleas KochJai Zamariyah Furukawa, MD [facility] (P) [phone]

## 2016-02-16 NOTE — Progress Notes (Signed)
Notified Dr. Sung AmabileSimonds that safety sitter is not available at this time. Placed on Recruitment consultantsafety sitter list. Will continue to monitor pt. Nelda MarseilleJenny Thacker, RN

## 2016-02-16 NOTE — Progress Notes (Signed)
eLink Physician-Brief Progress Note Patient Name: Janice Simmons DOB: 1981-05-22 MRN: [id number]   Date of Service  02/16/2016  HPI/Events of Note  Per RN, pt went to rest room under her own power, then was found minimally responsive. Received naloxone and became more responsive, vomited. A friend is in room. RN expresses concern for surreptitious drug use  eICU Interventions  PRN naloxone ordered PRN ondansetron ordered Sitter at bedside     Intervention Category Major Interventions: Other:  Billy FischerDavid Talayla Doyel 02/16/2016, 1:25 AM

## 2016-02-16 NOTE — Progress Notes (Signed)
Received message from Central Telemetry that pt lead were off. Pt was found in the bathroom slumped over female companion was present with her and he was attempting to get her up. Pt was not responding to attempts and remained slumped over. Assistance was required to get the pt to the bed with wheelchair. Rapid response was called and after pt was placed in the bed she still remained like in a somnolent state when Rapid prayed open her eyes they were pin point she did respond to sternal rub. CBG done and WNL O2 stats were low and ventri mask was placed 15L O2 stats improved. Narcan was given and MD was called and informed what was going on. Pt began to vomit after Narcan and patient became noncompliant with attemps to clean and change her bed. She refused to allow cardiac monitor and MD discontinued that. She refused labs, vital signs and portable chest xray.Ilean SkillVeronica Mikaili Flippin LPN

## 2016-02-16 NOTE — Progress Notes (Signed)
eLink Physician-Brief Progress Note Patient Name: Janice DunkCandice XXXNobles DOB: Dec 05, 1980 MRN: [id number]   Date of Service  02/16/2016  HPI/Events of Note  Request for Foley catheter.   eICU Interventions  Will order Foley catheter placement.      Intervention Category Minor Interventions: Routine modifications to care plan (e.g. PRN medications for pain, fever)  Sommer,Steven Eugene 02/16/2016, 5:39 PM

## 2016-02-16 NOTE — Progress Notes (Signed)
Attempted right upper arm PICC placement x 4 without success. Was unable to thread guide wire first three times, and fourth time patient moved arm removing needle from vein. Aborted any further attempts. RN aware. Will continue to monitor for vascular needs.

## 2016-02-16 NOTE — Progress Notes (Signed)
Pt being transferred to , report called to lisa. Pt alert and orientated x 2.

## 2016-02-16 NOTE — Progress Notes (Signed)
Pt found this morning very lethargic and uneasy to arouse. Dr Mahala MenghiniSamtani present during this, unable to obtain good pulse ox on pt and placed on NRB. Pts sats back up to 90's and placed on venti mask. Boyfriend present during this. First dose of narcan given and second dose unable to give d/t loss of IV access. Second dose given at 1000 and pt threw up after, zophran given. Pt currently on cont pulse ox and NSR on tele. Pt is more arousable, alert an orientated x2 but very restless. Pt status changed to step down and Dr Molli KnockYacoub stated that pt did not need narcan gtt just pushes and close monitoring on step down. IV placed in L hand by IV team and order for PICC to be placed d/t pt being a very hard stick.

## 2016-02-16 NOTE — Progress Notes (Signed)
eLink Physician-Brief Progress Note Patient Name: Janice Simmons DOB: [birth year] MRN: [id number]   Date of Service  02/16/2016  HPI/Events of Note  Pt is refusing replacement of tele monitor  eICU Interventions  DC tele order     Intervention Category Intermediate Interventions: Other:  Billy FischerDavid Simonds 02/16/2016, 1:50 AM

## 2016-02-16 NOTE — Progress Notes (Signed)
Patient's O2 sat dropping and pinpoint pupils. Dr. Mahala MenghiniSamtani at bedside with RN. Patient to be transferred for narcan  Drip. Orders received and continuing to monitor patient.   [phone]  to find out dosing for patient. Per Merideth AbbeyMarkita, LPN she started coming to their center on 16th of May and receives 70mg  daily of methadone. Dr. Mahala MenghiniSamtani on floor and aware.   Called poison control of [place] [phone] @ 0900 on 02/16/16.

## 2016-02-16 NOTE — Progress Notes (Signed)
PULMONARY / CRITICAL CARE MEDICINE   Name: Janice Simmons MRN: [id number] DOB: 08/21/1981    ADMISSION DATE:  02/14/2016 CONSULTATION DATE:  02/14/16  REFERRING MD:  EDP  CHIEF COMPLAINT:  AMS  HISTORY OF PRESENT ILLNESS: 34yo female with hx polysubstance abuse and IVDU recently started on methadone.  Initially presented 5/23 with AMS and shallow respirations.  She improved with narcan push and had vomiting episode with concern for aspiration.  She was placed on narcan gtt and admitted to ICU.  She improved and was tx to floor.  On 5/25 she was found in the bathroom with her significant other, slumped over and minimally responsive.  She again improved with narcan but again had vomiting episode.  She remained intermittently somnolent requiring multiple narcan pushes and PCCM called back to assess for ICU tx.    SUBJECTIVE:    Groans, denies dyspnea.    VITAL SIGNS: BP 147/57 mmHg  Pulse 120  Temp(Src) 98.7 F (37.1 C) (Oral)  Resp 20  Ht 6\' 3"  (1.905 m)  Wt 137.9 kg (304 lb 0.2 oz)  BMI 38.00 kg/m2  SpO2 53%  LMP  (LMP Unknown)  HEMODYNAMICS:    VENTILATOR SETTINGS:    INTAKE / OUTPUT: I/O last 3 completed shifts: In: 1820.2 [P.O.:360; I.V.:1360.2; IV Piggyback:100] Out: 200 [Urine:200]   PHYSICAL EXAMINATION: General: Middle aged female, in NAD. Neuro: lethargic, arousable but will not open eyes HEENT: [place]/AT. PERRL, sclerae anicteric. Cardiovascular: RRR, no M/R/G.  Lungs: Respirations even and unlabored.  Diminished bases, sats 89% on 40% VM Abdomen: Obese, BS x 4, soft, NT/ND.  Musculoskeletal: No gross deformities, no edema.  Skin: Intact, warm, no rashes.  LABS:  BMET  Recent Labs Lab 02/14/16 2215 02/15/16 0230  NA 143 139  K 3.5 4.4  CL 102 101  CO2 23 29  BUN 6 7  CREATININE 1.54* 1.08*  GLUCOSE 165* 130*    Electrolytes  Recent Labs Lab 02/14/16 2215 02/15/16 0230  CALCIUM 9.2 8.9  MG  --  1.8  PHOS  --  3.2    CBC  Recent  Labs Lab 02/14/16 2215 02/15/16 0230  WBC 18.7* 12.9*  HGB 15.5* 14.3  HCT 51.0* 45.7  PLT 293 239    Coag's No results for input(s): APTT, INR in the last 168 hours.  Sepsis Markers  Recent Labs Lab 02/15/16 0126  LATICACIDVEN 2.6*    ABG  Recent Labs Lab 02/15/16 0918  PHART 7.365  PCO2ART 52.7*  PO2ART 70.0*    Liver Enzymes No results for input(s): AST, ALT, ALKPHOS, BILITOT, ALBUMIN in the last 168 hours.  Cardiac Enzymes  Recent Labs Lab 02/15/16 0126  TROPONINI 1.22*    Glucose  Recent Labs Lab 02/16/16 0107  GLUCAP 147*    Imaging No results found.   STUDIES:  CXR 05/23 > right basilar opacity.  CULTURES: Blood 05/24 > Sputum 05/24 >  ANTIBIOTICS: Unasyn 05/23 >  SIGNIFICANT EVENTS: 05/24 > admitted with presumed overdose and probable aspiration PNA.  LINES/TUBES: None.  DISCUSSION: 35 y.o. F with hx polysubstance abuse and hx of previous OD, admitted 05/24 with presumed OD and probable aspiration PNA.  Now with recurrent AMS suspect r/t sneaking drugs.   ASSESSMENT / PLAN:  NEUROLOGIC A:   Acute encephalopathy - presumed due to OD. She goes to methadone clinic and uses heroine recreationally as well.  Initially improved, now suspect boyfriend has been bringing her drugs - found in bathroom with new AMS with boyfriend at  her side x2 this am.    Hx depression, anxiety, polysubstance abuse, IVDU, elopement last admission. P: tx back ICU  Narcan gtt  Stat UDS  Monitor closely. Flight risk / elopement precautions. Polysubstance abuse counseling.   PULMONARY A: Acute hypoxic respiratory failure - likely due to aspiration + hypoventilation. Probable aspiration PNA - had vomiting episode x3 after narcan administered. P:   Continue supplemental O2 as needed to maintain SpO2 > 92%. Abx / cultures per ID section. Pulmonary hygiene. CXR now  Stat ABG    CARDIOVASCULAR A:  No acute issues. P:  Monitor  hemodynamics. Assess troponin, lactate.  RENAL A:   AKI. P:   Cont gentle hydration  F/u chem   GASTROINTESTINAL A:   Obesity. Nutrition. P:   NPO for now 2/2 AMS.   HEMATOLOGIC A:   VTE Prophylaxis. P:  SCD's / heparin. CBC in AM.  INFECTIOUS A:   Probable aspiration PNA - had vomiting episode after narcan administered. P:   Abx as above (unasyn).  Follow cultures as above.  ENDOCRINE A:   No acute issues.   P:   Monitor glucose on BMP.  Family updated: Significant other updated at bedside, mother updated via phone by RN 5/25.  Interdisciplinary Family Meeting v Palliative Care Meeting:  Due by: 05/30.    Dirk Dress, NP 02/16/2016  11:27 AM Pager: [phone] or [phone]  Attending Note:  35 year old morbidly obese female with chronic pain that goes to the methadone clinic presenting with an overdose.  Patient was doing well and transferred to the floors then evidently overnight started behaving as an overdose again.  Unsure where she obtained the narcotics.  She denies it.  Also, vomited 4 times and there is a concern for aspiration.  She is quite lethargic but responds to narcan.  Will transfer back to the ICU for narcan drip and for closer monitoring.  The patient is critically ill with multiple organ systems failure and requires high complexity decision making for assessment and support, frequent evaluation and titration of therapies, application of advanced monitoring technologies and extensive interpretation of multiple databases.   Critical Care Time devoted to patient care services described in this note is  35  Minutes. This time reflects time of care of this signee Dr Koren Bound. This critical care time does not reflect procedure time, or teaching time or supervisory time of PA/NP/Med student/Med Resident etc but could involve care discussion time.  Alyson Reedy, M.D. [facility] Pulmonary/Critical Care Medicine. Pager:  [phone]. After hours pager: [phone].

## 2016-02-16 NOTE — Progress Notes (Signed)
Pt did was unable to urinate. MD called and notified. Foley placed in as ordered. Pt tolerated well. Urine returned and sample sent for results

## 2016-02-16 NOTE — Progress Notes (Signed)
Peripherally Inserted Central Catheter/Midline Placement  The IV Nurse has discussed with the patient and/or persons authorized to consent for the patient, the purpose of this procedure and the potential benefits and risks involved with this procedure.  The benefits include less needle sticks, lab draws from the catheter and patient may be discharged home with the catheter.  Risks include, but not limited to, infection, bleeding, blood clot (thrombus formation), and puncture of an artery; nerve damage and irregular heat beat.  Alternatives to this procedure were also discussed.  PICC/Midline Placement Documentation        Lisabeth DevoidGibbs, Fiorela Pelzer Jeanette 02/16/2016, 3:29 PM Phone consent obtained from mother Hubert AzureWanda Parrish

## 2016-02-16 NOTE — Significant Event (Signed)
Rapid Response Event Note  Overview: Time Called: 0100 Arrival Time: 0102 Event Type: Cardiac  Initial Focused Assessment: Unresponsive with shallow breathing   Interventions: Narcan 0.4mg    Event Summary: Called to patients bedside for reports of being unresponsive. Patient was able to walk to the restroom unassisted but became unresponsive with shallow breathing. Patients chart was reviewed with unit RN. There is a friend at patients bedside who witness patient walking prior to becoming unresponsive. Skin is warm and dry. Heart sounds regular but tachycardic at 108. O2 sats are 79-81 on [place]. A 50% O2 mask was applied. Sats continued in the low 80's. Patient was given Narcan 0.4mg  IVP and became responsive. O2 sats increased to 95%. On-call provider was made aware of patients condition. Orders receive for PRN Narcan. We will continue to assist with care as needed.              Beryl MeagerHarbison, [place]

## 2016-02-17 ENCOUNTER — Inpatient Hospital Stay (HOSPITAL_COMMUNITY): Payer: Self-pay

## 2016-02-17 DIAGNOSIS — T50901A Poisoning by unspecified drugs, medicaments and biological substances, accidental (unintentional), initial encounter: Secondary | ICD-10-CM

## 2016-02-17 DIAGNOSIS — E872 Acidosis: Secondary | ICD-10-CM

## 2016-02-17 DIAGNOSIS — R7989 Other specified abnormal findings of blood chemistry: Secondary | ICD-10-CM

## 2016-02-17 LAB — BASIC METABOLIC PANEL
Anion gap: 6 (ref 5–15)
BUN: 9 mg/dL (ref 6–20)
CALCIUM: 8.4 mg/dL — AB (ref 8.9–10.3)
CHLORIDE: 104 mmol/L (ref 101–111)
CO2: 27 mmol/L (ref 22–32)
Creatinine, Ser: 0.68 mg/dL (ref 0.44–1.00)
Glucose, Bld: 141 mg/dL — ABNORMAL HIGH (ref 65–99)
Potassium: 3.8 mmol/L (ref 3.5–5.1)
SODIUM: 137 mmol/L (ref 135–145)

## 2016-02-17 LAB — CBC
HCT: 37.7 % (ref 36.0–46.0)
HEMOGLOBIN: 11.9 g/dL — AB (ref 12.0–15.0)
MCH: 29.2 pg (ref 26.0–34.0)
MCHC: 31.6 g/dL (ref 30.0–36.0)
MCV: 92.4 fL (ref 78.0–100.0)
PLATELETS: 150 10*3/uL (ref 150–400)
RBC: 4.08 MIL/uL (ref 3.87–5.11)
RDW: 13.7 % (ref 11.5–15.5)
WBC: 9.9 10*3/uL (ref 4.0–10.5)

## 2016-02-17 MED ORDER — ACETAMINOPHEN 325 MG PO TABS
650.0000 mg | ORAL_TABLET | Freq: Four times a day (QID) | ORAL | Status: DC | PRN
  Administered 2016-02-17 – 2016-02-18 (×4): 650 mg via ORAL
  Filled 2016-02-17 (×4): qty 2

## 2016-02-17 NOTE — Progress Notes (Addendum)
PULMONARY / CRITICAL CARE MEDICINE   Name: Janice Simmons MRN: [id number] DOB: 04-24-1981    ADMISSION DATE:  02/14/2016 CONSULTATION DATE:  02/14/16  REFERRING MD:  EDP  CHIEF COMPLAINT:  AMS  HISTORY OF PRESENT ILLNESS: 35 yo female with hx polysubstance abuse and IVDU recently started on methadone.  Initially presented 5/23 with AMS and shallow respirations.  She improved with narcan push and had vomiting episode with concern for aspiration.  She was placed on narcan gtt and admitted to ICU.  She improved and was tx to floor.  On 5/25 she was found in the bathroom with her significant other, slumped over and minimally responsive.  She again improved with narcan but again had vomiting episode.  She remained intermittently somnolent requiring multiple narcan pushes and PCCM called back to assess for ICU tx.    SUBJECTIVE:    Alert and oriented/ Aggitated  VITAL SIGNS: BP 113/71 mmHg  Pulse 81  Temp(Src) 98 F (36.7 C) (Oral)  Resp 20  Ht 6\' 3"  (1.905 m)  Wt 310 lb 13.6 oz (141 kg)  BMI 38.85 kg/m2  SpO2 88%  LMP  (LMP Unknown)  HEMODYNAMICS:    VENTILATOR SETTINGS: Vent Mode:  [-]  FiO2 (%):  [40 %] 40 %  INTAKE / OUTPUT: I/O last 3 completed shifts: In: 1996.4 [I.V.:1696.4; IV Piggyback:300] Out: 2150 [Urine:2150]   PHYSICAL EXAMINATION: General: Middle aged female, in NAD, irritable. Neuro: lethargic,alert, moving all extremities HEENT: [place]/AT. PERRL, sclerae anicteric. Cardiovascular: RRR, no M/R/G.  Lungs: Respirations even and unlabored. Diminished bases, crackles with some wheezing, Sats 92% on RA Abdomen: Obese, BS x 4, soft, NT/ND.  Musculoskeletal: No gross deformities, no edema.  Skin: Intact, warm, no rashes.  LABS:  BMET  Recent Labs Lab 02/14/16 2215 02/15/16 0230 02/16/16 1434  NA 143 139 140  K 3.5 4.4 4.6  CL 102 101 100*  CO2 23 29 33*  BUN 6 7 15   CREATININE 1.54* 1.08* 0.90  GLUCOSE 165* 130* 134*    Electrolytes  Recent  Labs Lab 02/14/16 2215 02/15/16 0230 02/16/16 1434  CALCIUM 9.2 8.9 8.4*  MG  --  1.8  --   PHOS  --  3.2  --     CBC  Recent Labs Lab 02/14/16 2215 02/15/16 0230  WBC 18.7* 12.9*  HGB 15.5* 14.3  HCT 51.0* 45.7  PLT 293 239    Coag's No results for input(s): APTT, INR in the last 168 hours.  Sepsis Markers  Recent Labs Lab 02/15/16 0126  LATICACIDVEN 2.6*    ABG  Recent Labs Lab 02/15/16 0918 02/16/16 1145  PHART 7.365 7.410  PCO2ART 52.7* 52.4*  PO2ART 70.0* 57.0*    Liver Enzymes No results for input(s): AST, ALT, ALKPHOS, BILITOT, ALBUMIN in the last 168 hours.  Cardiac Enzymes  Recent Labs Lab 02/15/16 0126  TROPONINI 1.22*    Glucose  Recent Labs Lab 02/16/16 0107 02/16/16 1238 02/16/16 1315  GLUCAP 147* 151* 139*    Imaging Dg Chest Port 1 View  02/17/2016  CLINICAL DATA:  Shortness of breath EXAM: PORTABLE CHEST 1 VIEW COMPARISON:  02/16/2016. FINDINGS: Cardiomegaly with pulmonary vascular congestion. Persistent but improving bilateral pulmonary infiltrates. Atelectatic changes noted both mid lungs. Tiny bilateral pleural effusions cannot be excluded. No pneumothorax . IMPRESSION: 1. Cardiomegaly with pulmonary vascular congestion. 2. Persistent but improving bilateral pulmonary infiltrates. Atelectatic changes noted both mid lungs. Small bilateral pleural effusions cannot be excluded. Electronically Signed   By: Maisie Fus  Register  On: 02/17/2016 06:57   Dg Chest Portable 1 View  02/16/2016  CLINICAL DATA:  Hypoxia. EXAM: PORTABLE CHEST 1 VIEW COMPARISON:  02/14/2016. FINDINGS: Low lung volumes. No visible support tubes and apparatus. Cardiomegaly. Increasing BILATERAL pulmonary opacities could represent edema, atelectasis, or pneumonia. Suspected small BILATERAL effusions. No pneumothorax. Bones unchanged. IMPRESSION: Worsening aeration. Decreasing lung volumes associated with increasing BILATERAL pulmonary opacities. Correlate clinically  for edema versus infiltrates. Electronically Signed   By: Elsie StainJohn T Curnes M.D.   On: 02/16/2016 12:24     STUDIES:  CXR 05/26 > persistent  but improving bilateral infiltrates/ mid lobe atelectasis bilaterally  CULTURES: Blood 05/24 > Sputum 05/24 >  ANTIBIOTICS: Unasyn 05/23 >  SIGNIFICANT EVENTS: 05/24 > admitted with presumed overdose and probable aspiration PNA.  LINES/TUBES: None.  DISCUSSION: 35 y.o. F with hx polysubstance abuse and hx of previous OD, admitted 05/24 with presumed OD and probable aspiration PNA.  Now with recurrent AMS suspect r/t sneaking drugs.   ASSESSMENT / PLAN:  NEUROLOGIC A:   Acute encephalopathy - presumed due to OD. She goes to methadone clinic and uses heroine recreationally as well.  Initially improved, now suspect boyfriend has been bringing her drugs - found in bathroom with new AMS with boyfriend at her side x2 this am.    Hx depression, anxiety, polysubstance abuse, IVDU, elopement last admission. UDS positive for benzos Awake and alert/ irritable P: Narcan gtt x another 24 hours ( through 5/27) Monitor closely. Flight risk / elopement precautions. Polysubstance abuse counseling.   PULMONARY A: Acute hypoxic respiratory failure - likely due to aspiration + hypoventilation. Probable aspiration PNA - had vomiting episode x3 after narcan administered. persistent  but improving bilateral infiltrates/ mid lobe atelectasis bilaterally P:   Continue supplemental O2 as needed to maintain SpO2 > 92%. Oxygen while sleeping ( Desats with sleep) Abx / cultures per ID section. Pulmonary hygiene. IS Q1 while awake Mobilize/ OOB to chair CXR 5/27  Stat ABG    CARDIOVASCULAR A:  No acute issues. P:  Telemetry Monitor hemodynamics. Repeat  troponin, lactate to confirm normalization  RENAL A:   AKI. P:   Cont gentle hydration  Consider decreasing IVF once taking po's well. F/u chem 5/27  GASTROINTESTINAL A:    Obesity. Nutrition. P:   Tolerating a regular diet.  HEMATOLOGIC A:   VTE Prophylaxis. P:  SCD's / heparin. CBC in 5/27.  INFECTIOUS A:   Probable aspiration PNA - had vomiting episode after narcan administered. Persistent  but improving bilateral infiltrates/ mid lobe atelectasis bilaterally Afebrile WBC down trending P:   Abx as above (unasyn).  Follow cultures as above. Trend WBC and fever curve  ENDOCRINE A:   No acute issues.   P:   CBG's 130's-151 Consider HGB A1C / baseline value  Family updated: Mother and sister updated at bedside. Sister stated that patient told her she was taking 8-30 Xanax a day in addition to Methadone. She was clean for 9 months while in [place] in [place]. That rehab facility has agreed to take her back if she is willing to go. I will consult social work and case management.  Interdisciplinary Family Meeting v Palliative Care Meeting:  Due by: 05/30.    Bevelyn NgoSarah F. Groce, AGACNP-BC [place] Pulmonary/Critical Care Medicine 02/17/2016 [phone]  PCCM Attending Note: Patient seen and examined with nurse practitioner. Please refer to her note which I reviewed in detail. 35 year old female with polysubstance abuse. Patient denies any suicidal ideation or desire to die. She  continues on Narcan infusion with significantly improved respiratory status and mentation. I am concerned that with discontinuing continuous infusion of Narcan the patient's methadone which is likely still in her system will cause respiratory depression and altered mentation again. We'll closely monitor the patient and likely transition off of Narcan infusion later this evening or tomorrow morning. Continuing empiric antibiotic coverage for aspiration with Unasyn. Continuing to trend lactic acidosis and elevated troponin I.checking transthoracic echocardiogram.  I spent a total of 31 minutes of critical care time today caring for the patient, discussing the plan of care with the  patient's family at bedside as well as the patient, and reviewing the patient's electronic medical record.  Donna Christen Jamison Neighbor, M.D. [facility] Pulmonary & Critical Care Pager:  [phone] After 3pm or if no response, call [phone] 4:11 PM 02/17/2016

## 2016-02-17 NOTE — Progress Notes (Signed)
CSW consult acknowledged re: current substance abuse. Patient is a 35 yo female with hx of polysubstance abuse and IVDU recently started on methadone. Patient uses heroin recreationally as well. Initially presented 5/23 with AMS and shallow respirations.It is suspected that boyfriend has been bringing Patient drugs while in the hospital. It should also be noted that Patient eloped during last admission. CSW following for polysubstance abuse counseling and substance abuse resources.     Lance MussAshley Gardner,MSW, LCSW [facility] Clinical Social Worker [phone]

## 2016-02-18 ENCOUNTER — Inpatient Hospital Stay (HOSPITAL_COMMUNITY): Payer: Self-pay

## 2016-02-18 DIAGNOSIS — T50901S Poisoning by unspecified drugs, medicaments and biological substances, accidental (unintentional), sequela: Secondary | ICD-10-CM

## 2016-02-18 DIAGNOSIS — J9601 Acute respiratory failure with hypoxia: Secondary | ICD-10-CM

## 2016-02-18 MED ORDER — PERFLUTREN LIPID MICROSPHERE
1.0000 mL | INTRAVENOUS | Status: AC | PRN
  Filled 2016-02-18: qty 10

## 2016-02-18 NOTE — Progress Notes (Signed)
*  PRELIMINARY RESULTS* Echocardiogram 2D Echocardiogram has been performed.  Janice Simmons 02/18/2016, 12:35 PM

## 2016-02-18 NOTE — Progress Notes (Signed)
PULMONARY / CRITICAL CARE MEDICINE   Name: Janice Simmons MRN: [id number] DOB: 16-Oct-1980    ADMISSION DATE:  02/14/2016 CONSULTATION DATE:  02/14/16  REFERRING MD:  EDP  CHIEF COMPLAINT:  AMS  HISTORY OF PRESENT ILLNESS: 35 yo female with hx polysubstance abuse and IVDU recently started on methadone.  Initially presented 5/23 with AMS and shallow respirations.  She improved with narcan push and had vomiting episode with concern for aspiration.  She was placed on narcan gtt and admitted to ICU.  She improved and was tx to floor.  On 5/25 she was found in the bathroom with her significant other, slumped over and minimally responsive.  She again improved with narcan but again had vomiting episode.  She remained intermittently somnolent requiring multiple narcan pushes and PCCM called back to assess for ICU tx.    SUBJECTIVE:   No events overnight. Denies any chest pain or pressure. Denies any dyspnea or cough.  REVIEW OF SYSTEMS:  No nausea, emesis, or abdominal pain. No fever or chills.  VITAL SIGNS: BP 138/79 mmHg  Pulse 68  Temp(Src) 98.7 F (37.1 C) (Axillary)  Resp 20  Ht 6\' 3"  (1.905 m)  Wt 304 lb 0.2 oz (137.9 kg)  BMI 38.00 kg/m2  SpO2 93%  LMP  (LMP Unknown)  HEMODYNAMICS:    VENTILATOR SETTINGS:    INTAKE / OUTPUT: I/O last 3 completed shifts: In: 4896 [P.O.:1200; I.V.:3096; IV Piggyback:600] Out: 5965 [Urine:5965]   PHYSICAL EXAMINATION: General: White female. No distress. Sleeping until awoken. Neuro: CN 2-12 in tact. Grossly nonfocal. A & Ox4. HEENT: MMM. No scleral icterus or injection. Cardiovascular: Regular rate. No edema. No JVD.  Lungs: CTAB. Normal work of breathing. Abdomen: Soft. Protuberant. Nontender. Integument:  Warm & dry. No rash on exposed skin.  LABS:  BMET  Recent Labs Lab 02/15/16 0230 02/16/16 1434 02/17/16 0955  NA 139 140 137  K 4.4 4.6 3.8  CL 101 100* 104  CO2 29 33* 27  BUN 7 15 9   CREATININE 1.08* 0.90 0.68   GLUCOSE 130* 134* 141*    Electrolytes  Recent Labs Lab 02/15/16 0230 02/16/16 1434 02/17/16 0955  CALCIUM 8.9 8.4* 8.4*  MG 1.8  --   --   PHOS 3.2  --   --     CBC  Recent Labs Lab 02/14/16 2215 02/15/16 0230 02/17/16 0955  WBC 18.7* 12.9* 9.9  HGB 15.5* 14.3 11.9*  HCT 51.0* 45.7 37.7  PLT 293 239 150    Coag's No results for input(s): APTT, INR in the last 168 hours.  Sepsis Markers  Recent Labs Lab 02/15/16 0126  LATICACIDVEN 2.6*    ABG  Recent Labs Lab 02/15/16 0918 02/16/16 1145  PHART 7.365 7.410  PCO2ART 52.7* 52.4*  PO2ART 70.0* 57.0*    Liver Enzymes No results for input(s): AST, ALT, ALKPHOS, BILITOT, ALBUMIN in the last 168 hours.  Cardiac Enzymes  Recent Labs Lab 02/15/16 0126  TROPONINI 1.22*    Glucose  Recent Labs Lab 02/16/16 0107 02/16/16 1238 02/16/16 1315  GLUCAP 147* 151* 139*    Imaging No results found.   STUDIES:  CXR 05/26 > persistent  but improving bilateral infiltrates/ mid lobe atelectasis bilaterally TTE 05/27: EF 60-65% & normal wall motion without regional abnormality. Normal diastolic function.  MICROBIOLOGY: Blood 05/24 > MRSA PCR 5/24:  Negative   ANTIBIOTICS:  Unasyn 05/24 >  SIGNIFICANT EVENTS: 05/24 - admitted with presumed overdose and probable aspiration PNA. 05/25 - Transfer to  ICU on narcan gtt  LINES/TUBES: Foley 5/26 - 5/27 PIV x3  ASSESSMENT / PLAN:  NEUROLOGIC A:   Acute Encephalopathy - Secondary to unintentional OD. Polysubstance Overdose - No suicidal ideation. Boyfriend may be bringing drugs to hospital. Found in bathroom altered w/ boyfriend x2 AM of transfer. H/O Depression/Anxiety H/O IVDU H/O Polysubstance Abuse  P: D/C narcan gtt today Monitor closely. Flight risk / elopement precautions. Polysubstance abuse counseling.   PULMONARY A: Acute Hypoxic Respiratory Failure - Secondary to aspiration & hypoventilation from OD. Improving. Aspiration  Pneumonia vs Pneumonitis  P:   Wean FiO2 for Sat >94% IS for pulmonary toilette  CARDIOVASCULAR A:  Elevated Troponin I - Suspect due to acidosis.Echocardiogram without wall motion abnormality.  P:  Monitor on Telemetry Vitals per unit protocol Checking Troponin I this morning  RENAL A:   Acute Renal Failure - Resolved. Lactic Acidosis  P:   Trending electrolytes & renal function daily Monitoring UOP D/C MIVF Checking LA this AM D/C foley catheter  GASTROINTESTINAL A:   Obesity.  P:   Regular diet.  HEMATOLOGIC A:   Anemia - Mild. No signs of active bleeding.  P:  Trending cell counts daily w/ CBC SCDs Heparin [place] q8hr  INFECTIOUS A:   Aspiration Pneumonia vs Pneumonitis  P:   Unasyn Day #4/7 Trend WBC and fever curve  ENDOCRINE A:   No acute issues.    P:   Monitor glucose on daily labs.  Family updated: Mother and sister updated at bedside 5/26 by Dr. Jamison Neighbor.  Interdisciplinary Family Meeting v Palliative Care Meeting:  Due by 05/30.   TODAY'S SUMMARY:  35 y.o. F with hx polysubstance abuse and hx of previous OD, admitted 05/24 with presumed OD and probable aspiration PNA.  Now with recurrent AMS suspect due to taking illicit medications while hospitalized. Patient continuing to clinically improve. If she remains stable neurologically off her Narcan infusion today we may be able to transition the patient out of the intensive care unit for further monitoring and eventual discharge.   I spent a total of 34 minutes of critical care time today caring for the patient and reviewing the patient's electronic medical record.  Donna Christen Jamison Neighbor, M.D. [facility] Pulmonary & Critical Care Pager:  [phone] After 3pm or if no response, call [phone] 7:30 AM 02/18/2016

## 2016-02-18 NOTE — Progress Notes (Signed)
Pt refusing phlebotomy lab draw.

## 2016-02-19 ENCOUNTER — Inpatient Hospital Stay (HOSPITAL_COMMUNITY): Payer: MEDICAID

## 2016-02-19 DIAGNOSIS — T50901D Poisoning by unspecified drugs, medicaments and biological substances, accidental (unintentional), subsequent encounter: Secondary | ICD-10-CM

## 2016-02-19 LAB — CBC WITH DIFFERENTIAL/PLATELET
Basophils Absolute: 0 10*3/uL (ref 0.0–0.1)
Basophils Relative: 0 %
EOS ABS: 0.2 10*3/uL (ref 0.0–0.7)
Eosinophils Relative: 2 %
HCT: 39.9 % (ref 36.0–46.0)
Hemoglobin: 13 g/dL (ref 12.0–15.0)
LYMPHS PCT: 32 %
Lymphs Abs: 2.5 10*3/uL (ref 0.7–4.0)
MCH: 28.9 pg (ref 26.0–34.0)
MCHC: 32.6 g/dL (ref 30.0–36.0)
MCV: 88.7 fL (ref 78.0–100.0)
MONO ABS: 0.5 10*3/uL (ref 0.1–1.0)
Monocytes Relative: 6 %
Neutro Abs: 4.6 10*3/uL (ref 1.7–7.7)
Neutrophils Relative %: 60 %
Platelets: 174 10*3/uL (ref 150–400)
RBC: 4.5 MIL/uL (ref 3.87–5.11)
RDW: 13.1 % (ref 11.5–15.5)
WBC: 7.7 10*3/uL (ref 4.0–10.5)

## 2016-02-19 LAB — BASIC METABOLIC PANEL
Anion gap: 8 (ref 5–15)
BUN: 5 mg/dL — ABNORMAL LOW (ref 6–20)
CALCIUM: 9.1 mg/dL (ref 8.9–10.3)
CHLORIDE: 102 mmol/L (ref 101–111)
CO2: 26 mmol/L (ref 22–32)
Creatinine, Ser: 0.72 mg/dL (ref 0.44–1.00)
GFR calc non Af Amer: >60 mL/min (ref >60)
Glucose, Bld: 113 mg/dL — ABNORMAL HIGH (ref 65–99)
Potassium: 3 mmol/L — ABNORMAL LOW (ref 3.5–5.1)
SODIUM: 136 mmol/L (ref 135–145)

## 2016-02-19 LAB — RENAL FUNCTION PANEL
Albumin: 3.2 g/dL — ABNORMAL LOW (ref 3.5–5.0)
Anion gap: 10 (ref 5–15)
BUN: 6 mg/dL (ref 6–20)
CHLORIDE: 101 mmol/L (ref 101–111)
CO2: 25 mmol/L (ref 22–32)
CREATININE: 0.68 mg/dL (ref 0.44–1.00)
Calcium: 8.9 mg/dL (ref 8.9–10.3)
Glucose, Bld: 113 mg/dL — ABNORMAL HIGH (ref 65–99)
Phosphorus: 2.4 mg/dL — ABNORMAL LOW (ref 2.5–4.6)
Potassium: 2.9 mmol/L — ABNORMAL LOW (ref 3.5–5.1)
SODIUM: 136 mmol/L (ref 135–145)

## 2016-02-19 LAB — MAGNESIUM
MAGNESIUM: 1.5 mg/dL — AB (ref 1.7–2.4)
MAGNESIUM: 1.6 mg/dL — AB (ref 1.7–2.4)

## 2016-02-19 MED ORDER — POTASSIUM CHLORIDE CRYS ER 20 MEQ PO TBCR
40.0000 meq | EXTENDED_RELEASE_TABLET | Freq: Once | ORAL | Status: AC
  Administered 2016-02-19: 40 meq via ORAL
  Filled 2016-02-19: qty 2

## 2016-02-19 MED ORDER — AMOXICILLIN-POT CLAVULANATE 875-125 MG PO TABS: 1.0000 | ORAL_TABLET | Freq: Two times a day (BID) | ORAL | Status: DC

## 2016-02-19 MED ORDER — MAGNESIUM SULFATE 2 GM/50ML IV SOLN
2.0000 g | Freq: Once | INTRAVENOUS | Status: AC
  Administered 2016-02-19: 2 g via INTRAVENOUS
  Filled 2016-02-19: qty 50

## 2016-02-19 NOTE — Discharge Summary (Addendum)
Physician Discharge Summary  Patient ID: Salem Mastrogiovanni MRN: [id number] DOB/AGE: [birth year] 35 y.o.  Admit date: 02/14/2016 Discharge date: 02/19/2016    Discharge Diagnoses:  Acute Encephalopathy  Polysubstance Overdose  H/O Depression/Anxiety H/O IVDU H/O Polysubstance Abuse Acute Hypoxic Respiratory Failure . Aspiration Pneumonia vs Pneumonitis Elevated Troponin I Acute Renal Failure  Lactic Acidosis  Hypokalemia  Obesity  Anemia                                                                        DISCHARGE PLAN BY DIAGNOSIS     Acute Encephalopathy - Secondary to unintentional OD > resolved 5/28 Polysubstance Overdose - No suicidal ideation. Boyfriend may be bringing drugs to hospital. Found in bathroom altered w/ boyfriend x2 AM of transfer. H/O Depression/Anxiety H/O IVDU H/O Polysubstance Abuse  Discharge Plan: Follow up with pain clinic at discharge, previously followed by a methadone clinic Polysubstance abuse counseling. She has very poor insight / coping but no suicidal ideation  No prescriptions for narcotics given upon discharge.   Acute Hypoxic Respiratory Failure - Secondary to aspiration & hypoventilation from OD. Improving. Aspiration Pneumonia vs Pneumonitis  Discharge Plan:  Continue pulmonary hygiene post discharge  Recommend follow up CXR with PCP 1-2 weeks post discharge   Elevated Troponin I - Suspect due to acidosis.  Echocardiogram without wall motion abnormality.  Discharge Plan:  Resolved, no acute follow up at discharge Polysubstance cessation     Acute Renal Failure - Resolved. Lactic Acidosis - Resolved  Hypokalemia  Hypomagnesemia   Discharge Plan: Resolved, no further follow up at time of discharge   Obesity.  Discharge Plan:  Regular diet as tolerated.    Anemia - Mild. No signs of active bleeding.  Discharge Plan: Resolved, no acute follow up    Aspiration Pneumonia vs  Pneumonitis  Discharge Plan: Unasyn Day #6 No further abx at time of discharge                       DISCHARGE SUMMARY   Penelopi Weingarten is a 35 y.o. y/o female, smoker, with a PMH of depression / anxiety, hepatitis, polysubstance abuse and IVDU recently started on methadone who presented to [facility] on 02/14/16 with reports of altered mental status and shallow respirations.    The patients boyfriend states he took her to the methadone clinic early in the am on day of admission.  She returned home and was "fine".  Later, she became progressively more sleepy to the point he was unable to wake her.  EMS was activated due to altered mental status.  She was treated with 0.5 mg with improvement in mental status but had subsequent nausea and vomiting.  UDS was positive for benzodiazepines.  Urine pregnancy test was negative.  The patient was treated with a Narcan gtt and admitted to ICU.  She was treated with unasyn for possible aspiration PNA.  Initial CXR showed a patchy right basilar opacity.  Hospital course notable for hypokalemia requiring replacement and elevated troponin that was thought to be demand ischemia.  She improved and was tx to floor. On 5/25 she was found in the bathroom with her significant other, slumped over and minimally responsive. There was question  of illicit drug use in the bathroom but the boyfriend adamantly denied it.  She again improved with narcan but again had vomiting episode. She remained intermittently somnolent requiring multiple narcan pushes and PCCM called back to assess for ICU tx.The patient was treated again with a narcan gtt.  She was weaned off and transferred to a medical floor.  Follow up CXR on 5/28 demonstrated clear lungs.  The patient was medically cleared for discharge.   STUDIES:  CXR 05/26 >> persistent but improving bilateral infiltrates/ mid lobe atelectasis bilaterally TTE 05/27 >> EF 60-65% & normal wall motion without  regional abnormality. Normal diastolic function.  MICROBIOLOGY: Blood 05/24 > MRSA PCR 5/24: Negative   ANTIBIOTICS:  Unasyn 05/24 > 5/28  SIGNIFICANT EVENTS: 05/24 - admitted with presumed overdose and probable aspiration PNA. 05/25 - Transfer to ICU on narcan gtt  LINES/TUBES: Foley 5/26 - 5/27 PIV x3    Discharge Exam: General: White female. No distress.  Neuro: CN 2-12 in tact. Grossly nonfocal. A & Ox4. HEENT: MMM. No scleral icterus or injection. Cardiovascular: Regular rate. No edema. No JVD.  Lungs: CTAB. Normal work of breathing. Abdomen: Soft. Protuberant. Nontender. Integument: Warm & dry. No rash on exposed skin  Filed Vitals:   02/19/16 0800 02/19/16 0819 02/19/16 0954 02/19/16 1426  BP: 126/60  143/71 135/77  Pulse: 58  63 78  Temp:  98.3 F (36.8 C) 98.2 F (36.8 C) 98.6 F (37 C)  TempSrc:  Oral Oral Oral  Resp: 15  16 18   Height:      Weight:   304 lb 10.8 oz (138.2 kg)   SpO2: 94%  97% 95%     Discharge Labs  BMET  Recent Labs Lab 02/15/16 0230 02/16/16 1434 02/17/16 0955 02/19/16 0242 02/19/16 1103  NA 139 140 137 136 136  K 4.4 4.6 3.8 2.9* 3.0*  CL 101 100* 104 101 102  CO2 29 33* 27 25 26   GLUCOSE 130* 134* 141* 113* 113*  BUN 7 15 9 6  5*  CREATININE 1.08* 0.90 0.68 0.68 0.72  CALCIUM 8.9 8.4* 8.4* 8.9 9.1  MG 1.8  --   --  1.5* 1.6*  PHOS 3.2  --   --  2.4*  --     CBC  Recent Labs Lab 02/15/16 0230 02/17/16 0955 02/19/16 0242  HGB 14.3 11.9* 13.0  HCT 45.7 37.7 39.9  WBC 12.9* 9.9 7.7  PLT 239 150 174        Follow-up Information    Follow up with [facility]. Schedule an appointment as soon as possible for a visit in 1 week.   Why:  Call to schedule an appointment for 1-2 weeks post hospital follow up and to establish primary care    Contact information:   [address] [place] [address] [phone]      Follow up with Pain Clinic.   Why:   Follow up with your established pain clinic for chronic pain needs.       Medication List    TAKE these medications        oxycodone 30 MG immediate release tablet  Commonly known as:  ROXICODONE  Take 30 mg by mouth 3 (three) times daily.        Follow-up Information    Follow up with [facility]. Schedule an appointment as soon as possible for a visit in 1 week.   Why:  Call  to schedule an appointment for 1-2 weeks post hospital follow up and to establish primary care    Contact information:   [address] [place] [address] [phone]      Follow up with Pain Clinic.   Why:  Follow up with your established pain clinic for chronic pain needs.        Disposition:  Home.  No new home health needs identified at time of discharge.   Discharged Condition: Alyzae Hawkey has met maximum benefit of inpatient care and is medically stable and cleared for discharge.  Patient is pending follow up as above.      Time spent on disposition:  Greater than 35 minutes.   Signed: Noe Gens, NP-C [facility] Pgr: [phone] Office: [phone]   Pt seen and examined earlier on rounds with clear lungs and sats fine on RA. She demonstrated approp affect and regrets her behavior vis a vis recreational drug use resulted in admit and promises to modify it. She is not felt to be a threat to repeat this though her insight in terms of picking friends seems limited.  Her f/u cxr is clear, so further abx are optional at this point as there is no obvious residual asp injury and she is cleared for discharge  Christinia Gully, MD Pulmonary and [place] [phone] After 5:30 PM or weekends, call [phone]

## 2016-02-19 NOTE — Progress Notes (Signed)
PULMONARY / CRITICAL CARE MEDICINE   Name: Janice Simmons MRN: [id number] DOB: 03/07/1981    ADMISSION DATE:  02/14/2016 CONSULTATION DATE:  02/14/16  REFERRING MD:  EDP  CHIEF COMPLAINT:  AMS  HISTORY OF PRESENT ILLNESS: 35 yo female with hx polysubstance abuse and IVDU recently started on methadone.  Initially presented 5/23 with AMS and shallow respirations.  She improved with narcan push and had vomiting episode with concern for aspiration.  She was placed on narcan gtt and admitted to ICU.  She improved and was tx to floor.  On 5/25 she was found in the bathroom with her significant other, slumped over and minimally responsive.  She again improved with narcan but again had vomiting episode.  She remained intermittently somnolent requiring multiple narcan pushes and PCCM called back to assess for ICU tx.    SUBJECTIVE:  No cough/ sob on RA      VITAL SIGNS: BP 143/71 mmHg  Pulse 63  Temp(Src) 98.2 F (36.8 C) (Oral)  Resp 16  Ht  (1.905 m)  Wt 304 lb 10.8 oz (138.2 kg)  BMI 38.08 kg/m2  SpO2 97%  LMP  (LMP Unknown)  HEMODYNAMICS:    VENTILATOR SETTINGS:    INTAKE / OUTPUT: I/O last 3 completed shifts: In: 2522 [P.O.:720; I.V.:1152; IV Piggyback:650] Out: 3625 [Urine:3625]   PHYSICAL EXAMINATION: General: White female. No distress.   Neuro: CN 2-12 in tact. Grossly nonfocal. A & Ox4. HEENT: MMM. No scleral icterus or injection. Cardiovascular: Regular rate. No edema. No JVD.  Lungs: CTAB. Normal work of breathing. Abdomen: Soft. Protuberant. Nontender. Integument:  Warm & dry. No rash on exposed skin.  LABS:  BMET  Recent Labs Lab 02/16/16 1434 02/17/16 0955 02/19/16 0242  NA 140 137 136  K 4.6 3.8 2.9*  CL 100* 104 101  CO2 33* 27 25  BUN CREATININE 0.90 0.68 0.68  GLUCOSE 134* 141* 113*    Electrolytes  Recent Labs Lab 02/15/16 0230 02/16/16 1434 02/17/16 0955 02/19/16 0242  CALCIUM 8.9 8.4* 8.4* 8.9  MG 1.8  --   --   1.5*  PHOS 3.2  --   --  2.4*    CBC  Recent Labs Lab 02/15/16 0230 02/17/16 0955 02/19/16 0242  WBC 12.9* 9.9 7.7  HGB 14.3 11.9* 13.0  HCT 45.7 37.7 39.9  PLT 239 150 174    Coag's No results for input(s): APTT, INR in the last 168 hours.  Sepsis Markers  Recent Labs Lab 02/15/16 0126  LATICACIDVEN 2.6*    ABG  Recent Labs Lab 02/15/16 0918 02/16/16 1145  PHART 7.365 7.410  PCO2ART 52.7* 52.4*  PO2ART 70.0* 57.0*    Liver Enzymes  Recent Labs Lab 02/19/16 0242  ALBUMIN 3.2*    Cardiac Enzymes  Recent Labs Lab 02/15/16 0126  TROPONINI 1.22*    Glucose  Recent Labs Lab 02/16/16 0107 02/16/16 1238 02/16/16 1315  GLUCAP 147* 151* 139*    Imaging No results found.   STUDIES:  CXR 05/26 > persistent  but improving bilateral infiltrates/ mid lobe atelectasis bilaterally TTE 05/27: EF 60-65% & normal wall motion without regional abnormality. Normal diastolic function.  MICROBIOLOGY: Blood 05/24 > MRSA PCR 5/24:  Negative   ANTIBIOTICS:  Unasyn 05/24 >  SIGNIFICANT EVENTS: 05/24 - admitted with presumed overdose and probable aspiration PNA. 05/25 - Transfer to ICU on narcan gtt  LINES/TUBES: Foley 5/26 - 5/27 PIV x3  ASSESSMENT / PLAN:  NEUROLOGIC A:  Acute Encephalopathy - Secondary to unintentional OD > resolved 5/28 Polysubstance Overdose - No suicidal ideation. Boyfriend may be bringing drugs to hospital. Found in bathroom altered w/ boyfriend x2 AM of transfer. H/O Depression/Anxiety H/O IVDU H/O Polysubstance Abuse  P: D/C narcan gtt 5/27 Monitor closely. Polysubstance abuse counseling. She has very poor insight /coping but no suicidal ideation    PULMONARY A: Acute Hypoxic Respiratory Failure - Secondary to aspiration & hypoventilation from OD. Improving. Aspiration Pneumonia vs Pneumonitis  P:   Wean FiO2 for Sat >94% IS for pulmonary toilette Recheck cxr 5/28 and ? Discharge pm  5/28  CARDIOVASCULAR A:  Elevated Troponin I - Suspect due to acidosis.Echocardiogram without wall motion abnormality.  P:  No f/u needed     RENAL A:   Acute Renal Failure - Resolved. Lactic Acidosis- resolved  Hypokalemia > treated, no further losses noted P:   No further f/u    GASTROINTESTINAL A:   Obesity.  P:   Regular diet.  HEMATOLOGIC A:   Anemia - Mild. No signs of active bleeding.  P:  Trending cell counts daily w/ CBC SCDs Heparin [place] q8hr  INFECTIOUS A:   Aspiration Pneumonia vs Pneumonitis  P:   Unasyn Day #5/7 Ok to d/c on augmentinx 2 more days if goes home on day #5     ENDOCRINE A:   No acute issues.    P:   Monitor glucose on daily labs.   Sandrea HughsMichael Mosie Angus, MD Pulmonary and Critical Care Medicine [facility] Cell [phone] After 5:30 PM or weekends, call [phone]

## 2016-02-19 NOTE — Progress Notes (Signed)
eLink Physician-Brief Progress Note Patient Name: Janice DunkCandice XXXNobles DOB: 1981/04/22 MRN: [id number]   Date of Service  02/19/2016  HPI/Events of Note  K+ = 2.9, Mg++ = 1.5 and Creatinine  = 0.68.  eICU Interventions  Will order: 1. Replete K+ and Mg++ 2. BMP and Mg++ level at 12 noon.     Intervention Category Intermediate Interventions: Arrhythmia - evaluation and management;Electrolyte abnormality - evaluation and management  Keri Tavella Eugene 02/19/2016, 5:27 AM

## 2016-02-19 NOTE — Progress Notes (Signed)
Pt alert, oriented, pleasant. Report was given  To upcoming nurse. Waiting for transport

## 2016-02-19 NOTE — Progress Notes (Signed)
NURSING PROGRESS NOTE  Janice Simmons [id number] Discharge Data: 02/19/2016 7:07 PM Attending Provider: Alyson ReedyWesam G Yacoub, MD PCP:No PCP Per Patient     Janice Dunkandice Simmons to be D/C'd Home per MD order.  Discussed with the patient the After Visit Summary and all questions fully answered. All IV's discontinued with no bleeding noted. All belongings returned to patient for patient to take home.   Last Vital Signs:  Blood pressure 135/77, pulse 78, temperature 98.6 F (37 C), temperature source Oral, resp. rate 18, height 6\' 3"  (1.905 m), weight 138.2 kg (304 lb 10.8 oz), SpO2 95 %.  Discharge Medication List   Medication List    TAKE these medications        oxycodone 30 MG immediate release tablet  Commonly known as:  ROXICODONE  Take 30 mg by mouth 3 (three) times daily.

## 2016-02-20 LAB — CULTURE, BLOOD (ROUTINE X 2)
Culture: NO GROWTH
Culture: NO GROWTH

## 2016-02-21 MED FILL — Perflutren Lipid Microsphere IV Susp 1.1 MG/ML: INTRAVENOUS | Qty: 10 | Status: AC

## 2016-07-22 ENCOUNTER — Emergency Department (HOSPITAL_COMMUNITY)
Admission: EM | Admit: 2016-07-22 | Discharge: 2016-07-23 | Disposition: A | Discharge status: Home / Self Care | Payer: Self-pay | Attending: Emergency Medicine | Admitting: Emergency Medicine

## 2016-07-22 DIAGNOSIS — F1193 Opioid use, unspecified with withdrawal: Secondary | ICD-10-CM

## 2016-07-22 DIAGNOSIS — F1523 Other stimulant dependence with withdrawal: Secondary | ICD-10-CM | POA: Insufficient documentation

## 2016-07-22 DIAGNOSIS — F17210 Nicotine dependence, cigarettes, uncomplicated: Secondary | ICD-10-CM | POA: Insufficient documentation

## 2016-07-22 DIAGNOSIS — F13930 Sedative, hypnotic or anxiolytic use, unspecified with withdrawal, uncomplicated: Secondary | ICD-10-CM

## 2016-07-22 DIAGNOSIS — F11220 Opioid dependence with intoxication, uncomplicated: Secondary | ICD-10-CM | POA: Insufficient documentation

## 2016-07-22 DIAGNOSIS — F1123 Opioid dependence with withdrawal: Secondary | ICD-10-CM

## 2016-07-22 DIAGNOSIS — F13230 Sedative, hypnotic or anxiolytic dependence with withdrawal, uncomplicated: Secondary | ICD-10-CM

## 2016-07-22 LAB — CBC WITH DIFFERENTIAL/PLATELET
BASOS ABS: 0 10*3/uL (ref 0.0–0.1)
Basophils Relative: 0 %
Eosinophils Absolute: 0.1 10*3/uL (ref 0.0–0.7)
Eosinophils Relative: 1 %
HEMATOCRIT: 48.1 % — AB (ref 36.0–46.0)
HEMOGLOBIN: 16.9 g/dL — AB (ref 12.0–15.0)
Lymphocytes Relative: 30 %
Lymphs Abs: 4.8 10*3/uL — ABNORMAL HIGH (ref 0.7–4.0)
MCH: 30.7 pg (ref 26.0–34.0)
MCHC: 35.1 g/dL (ref 30.0–36.0)
MCV: 87.5 fL (ref 78.0–100.0)
Monocytes Absolute: 0.9 10*3/uL (ref 0.1–1.0)
Monocytes Relative: 6 %
NEUTROS ABS: 10 10*3/uL — AB (ref 1.7–7.7)
NEUTROS PCT: 63 %
Platelets: 286 10*3/uL (ref 150–400)
RBC: 5.5 MIL/uL — ABNORMAL HIGH (ref 3.87–5.11)
RDW: 13.2 % (ref 11.5–15.5)
WBC: 15.8 10*3/uL — ABNORMAL HIGH (ref 4.0–10.5)

## 2016-07-22 LAB — COMPREHENSIVE METABOLIC PANEL
ALT: 14 U/L (ref 14–54)
ANION GAP: 8 (ref 5–15)
AST: 20 U/L (ref 15–41)
Albumin: 5 g/dL (ref 3.5–5.0)
Alkaline Phosphatase: 110 U/L (ref 38–126)
BILIRUBIN TOTAL: 1.6 mg/dL — AB (ref 0.3–1.2)
BUN: 13 mg/dL (ref 6–20)
CO2: 29 mmol/L (ref 22–32)
Calcium: 9.8 mg/dL (ref 8.9–10.3)
Chloride: 100 mmol/L — ABNORMAL LOW (ref 101–111)
Creatinine, Ser: 0.75 mg/dL (ref 0.44–1.00)
GFR calc Af Amer: >60 mL/min (ref >60)
GFR calc non Af Amer: >60 mL/min (ref >60)
Glucose, Bld: 94 mg/dL (ref 65–99)
POTASSIUM: 3.1 mmol/L — AB (ref 3.5–5.1)
SODIUM: 137 mmol/L (ref 135–145)
TOTAL PROTEIN: 9 g/dL — AB (ref 6.5–8.1)

## 2016-07-22 LAB — I-STAT BETA HCG BLOOD, ED (MC, WL, AP ONLY): I-stat hCG, quantitative: <5 m[IU]/mL (ref <5)

## 2016-07-22 MED ORDER — GLYCOPYRROLATE 0.2 MG/ML IJ SOLN
0.1000 mg | Freq: Once | INTRAMUSCULAR | Status: AC
  Administered 2016-07-22: 0.1 mg via INTRAVENOUS
  Filled 2016-07-22: qty 1

## 2016-07-22 MED ORDER — ONDANSETRON HCL 4 MG/2ML IJ SOLN
4.0000 mg | Freq: Once | INTRAMUSCULAR | Status: AC
  Administered 2016-07-22: 4 mg via INTRAVENOUS
  Filled 2016-07-22: qty 2

## 2016-07-22 MED ORDER — SODIUM CHLORIDE 0.9 % IV BOLUS (SEPSIS)
1000.0000 mL | Freq: Once | INTRAVENOUS | Status: AC
  Administered 2016-07-22: 1000 mL via INTRAVENOUS

## 2016-07-22 MED ORDER — LORAZEPAM 2 MG/ML IJ SOLN
1.0000 mg | Freq: Once | INTRAMUSCULAR | Status: AC
  Administered 2016-07-22: 1 mg via INTRAVENOUS
  Filled 2016-07-22: qty 1

## 2016-07-22 NOTE — ED Notes (Signed)
Bed: WA17 Expected date:  Expected time:  Means of arrival:  Comments: 34y  F/ Detox from Xanax/Heroine

## 2016-07-22 NOTE — ED Triage Notes (Signed)
Per EMS- Pt has N/V/D after not having had xanax for 3 days and heroin since this morning.

## 2016-07-22 NOTE — ED Provider Notes (Signed)
[facility]-EMERGENCY DEPT Provider Note   CSN: [id number] Arrival date & time: 07/22/16  2048  By signing my name below, I, Phillis Haggis, attest that this documentation has been prepared under the direction and in the presence of Mattie Marlin, PA-C. Electronically Signed: Phillis Haggis, ED Scribe. 07/22/16. 9:51 PM.  History   Chief Complaint Chief Complaint  Patient presents with  . Withdrawal   The history is provided by the patient. No language interpreter was used.   HPI Comments: Janice Simmons is a 35 y.o. female with a hx of depression, hepatitis, drug overdose, IVDU, and polysubstance abuse brought in by EMS who presents to the Emergency Department complaining of gradually worsening nausea and vomiting. Pt reports associated diarrhea, abdominal pain, diaphoresis, headache, myalgias, visual "floaters" and had one seizure yesterday. Pt says that her seizure was witnessed and lasted about 3 minutes. She was seen having full body jerking movements and "being out of it." Her symptoms began after not having Xanax for 3 day. Last dose of  heroin this morning. Pt says that she used 50 mg of heroin today and typically takes 8-10 1mg  pills of Xanax per day. She has run out of the pills and does not have a prescription. Pt says that these symptoms are typical of her withdrawals. She is wanting to go into a detox program. Pt says that she was clean until one month ago, but has since been having worsening depression. Pt says she has been to [facility] in [place] and [place] detox programs. Pt was recently diagnosed with pneumonia, but only took 4 days of antibiotics. She continues to have a productive cough with green sputum. She denies fever, chills, congestion, ear pain, sore throat, chest pain, leg swelling, hematochezia, dysuria, hematuria, back pain, syncope, HI or SI.   Past Medical History:  Diagnosis Date  . Anxiety   . Depression   . Hepatitis   . IVDU (intravenous drug user)   .  Polysubstance abuse     Patient Active Problem List   Diagnosis Date Noted  . Overdose 02/15/2016  . Acute encephalopathy   . Polysubstance abuse   . Depression   . Anxiety   . Acute hypoxemic respiratory failure (HCC)   . Aspiration pneumonia of right middle lobe (HCC)   . Narcotic overdose   . Sepsis (HCC) 04/13/2014  . Cellulitis 04/13/2014  . IV drug abuse 04/13/2014  . Polysubstance (including opioids) dependence with physiological dependence (HCC) 11/12/2013    Past Surgical History:  Procedure Laterality Date  . TONSILLECTOMY      OB History    No data available      Home Medications    Prior to Admission medications   Medication Sig Start Date End Date Taking? Authorizing Provider  ALPRAZolam Prudy Feeler) 1 MG tablet Take 1 mg by mouth every 3 (three) hours.   Yes Historical Provider, MD  buprenorphine-naloxone (SUBOXONE) 8-2 MG SUBL SL tablet Place 1 tablet under the tongue 2 (two) times daily.   Yes Historical Provider, MD  gabapentin (NEURONTIN) 300 MG capsule Take 300 mg by mouth 3 (three) times daily.   Yes Historical Provider, MD  hydrOXYzine (ATARAX/VISTARIL) 25 MG tablet Take 25 mg by mouth every 6 (six) hours as needed for anxiety.   Yes Historical Provider, MD  ibuprofen (ADVIL,MOTRIN) 200 MG tablet Take 800 mg by mouth every 6 (six) hours as needed for moderate pain.   Yes Historical Provider, MD  lamoTRIgine (LAMICTAL) 100 MG tablet Take 100 mg by  mouth daily.   Yes Historical Provider, MD  omeprazole (PRILOSEC) 20 MG capsule Take 20 mg by mouth daily.   Yes Historical Provider, MD  potassium chloride (K-DUR,KLOR-CON) 10 MEQ tablet Take 10 mEq by mouth daily. 06/04/16 06/16/17 Yes Historical Provider, MD  QUEtiapine (SEROQUEL) 200 MG tablet Take 200 mg by mouth at bedtime.   Yes Historical Provider, MD  chlordiazePOXIDE (LIBRIUM) 25 MG capsule 50mg  PO TID x 1D, then 25-50mg  PO BID X 1D, then 25-50mg  PO QD X 1D 07/23/16   Jerre SimonJessica L Focht, PA  cloNIDine  (CATAPRES) 0.2 MG tablet Take 1 tablet (0.2 mg total) by mouth 2 (two) times daily. 07/23/16   Jerre SimonJessica L Focht, PA  dicyclomine (BENTYL) 20 MG tablet Take 1 tablet (20 mg total) by mouth 2 (two) times daily. 07/23/16   Jessica L Focht, PA  ondansetron (ZOFRAN ODT) 4 MG disintegrating tablet Take 1 tablet (4 mg total) by mouth every 8 (eight) hours as needed for nausea or vomiting. 07/23/16   Jerre SimonJessica L Focht, PA    Family History No family history on file.  Social History Social History  Substance Use Topics  . Smoking status: Current Every Day Smoker    Packs/day: 2.00    Types: Cigarettes  . Smokeless tobacco: Not on file  . Alcohol use Yes     Comment: pt reports binge drinking      Allergies   Review of patient's allergies indicates no known allergies.   Review of Systems Review of Systems  Constitutional: Positive for diaphoresis. Negative for chills and fever.  HENT: Negative for congestion, ear pain and sore throat.   Eyes: Positive for visual disturbance.  Respiratory: Positive for cough and shortness of breath.   Cardiovascular: Negative for chest pain and leg swelling.  Gastrointestinal: Positive for abdominal pain, diarrhea, nausea and vomiting. Negative for blood in stool.  Genitourinary: Negative for dysuria and hematuria.  Musculoskeletal: Positive for myalgias. Negative for back pain.  Neurological: Positive for seizures and headaches. Negative for syncope.  Psychiatric/Behavioral: Negative for suicidal ideas.  All other systems reviewed and are negative.  Physical Exam Updated Vital Signs BP 134/94   Pulse 110   Temp 98.4 F (36.9 C) (Oral)   Resp 18   SpO2 94%   Physical Exam  Constitutional: She is oriented to person, place, and time. She appears well-developed and well-nourished. No distress.  HENT:  Head: Normocephalic and atraumatic.  Eyes: Right conjunctiva is injected. Left conjunctiva is injected.  Neck: Normal range of motion.    Cardiovascular: Regular rhythm and normal heart sounds.  Tachycardia present.  Exam reveals no gallop and no friction rub.   No murmur heard. Pulses:      Radial pulses are 2+ on the right side, and 2+ on the left side.       Dorsalis pedis pulses are 2+ on the right side, and 2+ on the left side.  Pulmonary/Chest: Effort normal and breath sounds normal. No tachypnea. No respiratory distress. She has no decreased breath sounds. She has no wheezes. She has no rhonchi. She has no rales.  Abdominal: Soft. Bowel sounds are normal. She exhibits no distension. There is generalized tenderness. There is no CVA tenderness.  Musculoskeletal: Normal range of motion.  Neurological: She is alert and oriented to person, place, and time. Coordination normal.  Skin: Skin is warm and dry. She is not diaphoretic.  Psychiatric: Her speech is normal and behavior is normal. She exhibits a depressed mood. She expresses  no homicidal and no suicidal ideation. She expresses no suicidal plans and no homicidal plans.  Nursing note and vitals reviewed.  ED Treatments / Results  DIAGNOSTIC STUDIES: Oxygen Saturation is 94% on RA, adequate by my interpretation.    COORDINATION OF CARE: 9:55 PM-Discussed treatment plan which includes IV fluids with pt at bedside and pt agreed to plan.    Labs (all labs ordered are listed, but only abnormal results are displayed) Labs Reviewed  COMPREHENSIVE METABOLIC PANEL - Abnormal; Notable for the following:       Result Value   Potassium 3.1 (*)    Chloride 100 (*)    Total Protein 9.0 (*)    Total Bilirubin 1.6 (*)    All other components within normal limits  CBC WITH DIFFERENTIAL/PLATELET - Abnormal; Notable for the following:    WBC 15.8 (*)    RBC 5.50 (*)    Hemoglobin 16.9 (*)    HCT 48.1 (*)    Neutro Abs 10.0 (*)    Lymphs Abs 4.8 (*)    All other components within normal limits  RAPID URINE DRUG SCREEN, HOSP PERFORMED - Abnormal; Notable for the following:     Benzodiazepines POSITIVE (*)    All other components within normal limits  I-STAT BETA HCG BLOOD, ED ([facility], [facility], [facility] ONLY)    EKG  EKG Interpretation None       Radiology No results found.  Procedures Procedures (including critical care time)  Medications Ordered in ED Medications  sodium chloride 0.9 % bolus 1,000 mL (0 mLs Intravenous Stopped 07/23/16 0359)  ondansetron (ZOFRAN) injection 4 mg (4 mg Intravenous Given 07/22/16 2246)  LORazepam (ATIVAN) injection 1 mg (1 mg Intravenous Given 07/22/16 2246)  glycopyrrolate (ROBINUL) injection 0.1 mg (0.1 mg Intravenous Given 07/22/16 2247)  potassium chloride SA (K-DUR,KLOR-CON) CR tablet 40 mEq (40 mEq Oral Given 07/23/16 0146)  sodium chloride 0.9 % bolus 1,000 mL (0 mLs Intravenous Stopped 07/23/16 0507)  LORazepam (ATIVAN) injection 1 mg (1 mg Intravenous Given 07/23/16 0327)  ketorolac (TORADOL) 30 MG/ML injection 15 mg (15 mg Intravenous Given 07/23/16 0358)     Initial Impression / Assessment and Plan / ED Course  I have reviewed the triage vital signs and the nursing notes.  Pertinent labs & imaging results that were available during my care of the patient were reviewed by me and considered in my medical decision making (see chart for details).  Clinical Course    Pt Here with withdrawal symptoms from benzodiazepines and heroin. Patient states her symptoms are similar to previous withdrawals. Patient is looking for detox. Pt denies taking any other pain medication or tylenol. Patient states she had a seizure yesterday. Patient was given  Ativan, Robinul, and Zofran ED which relieved her symptoms. Patient's labs revealed patient to be hemoconcentrated likely due to dehydration. Patient was given 2 L of fluids in the ED. Patient was found to be hypokalemic she was given by mouth potassium in the ED. All other labs unremarkable. Patient with stable vital signs, afebrile, no seizure activity in the ED. Pt denies suicidal or  homicidal ideations.   Patient states her family is flying her to [place] today to be admitted to a rehabilitation center. Patient was discharged with clonidine, Bentyl, Zofran, taper of Librium. Instructed patient to follow with her PCP as soon as possible. Discussed strict return precautions the ED. Patient expressed understanding to the discharge instructions and was stable at time of discharge.  Pt case discussed with  Dr. Fayrene FearingJames who agrees with the above plan.   Final Clinical Impressions(s) / ED Diagnoses   Final diagnoses:  Benzodiazepine withdrawal without complication (HCC)  Narcotic withdrawal (HCC)   I personally performed the services described in this documentation, which was scribed in my presence. The recorded information has been reviewed and is accurate.     New Prescriptions New Prescriptions   CHLORDIAZEPOXIDE (LIBRIUM) 25 MG CAPSULE    50mg  PO TID x 1D, then 25-50mg  PO BID X 1D, then 25-50mg  PO QD X 1D   CLONIDINE (CATAPRES) 0.2 MG TABLET    Take 1 tablet (0.2 mg total) by mouth 2 (two) times daily.   DICYCLOMINE (BENTYL) 20 MG TABLET    Take 1 tablet (20 mg total) by mouth 2 (two) times daily.   ONDANSETRON (ZOFRAN ODT) 4 MG DISINTEGRATING TABLET    Take 1 tablet (4 mg total) by mouth every 8 (eight) hours as needed for nausea or vomiting.     Jerre SimonJessica L Focht, PA 07/23/16 [id number]    Rolland PorterMark James, MD 07/31/16 [phone]

## 2016-07-23 LAB — RAPID URINE DRUG SCREEN, HOSP PERFORMED
AMPHETAMINES: NOT DETECTED
BENZODIAZEPINES: POSITIVE — AB
Barbiturates: NOT DETECTED
Cocaine: NOT DETECTED
Opiates: NOT DETECTED
Tetrahydrocannabinol: NOT DETECTED

## 2016-07-23 MED ORDER — KETOROLAC TROMETHAMINE 30 MG/ML IJ SOLN
15.0000 mg | Freq: Once | INTRAMUSCULAR | Status: AC
  Administered 2016-07-23: 15 mg via INTRAVENOUS
  Filled 2016-07-23: qty 1

## 2016-07-23 MED ORDER — CLONIDINE HCL 0.2 MG PO TABS: 0.2000 mg | ORAL_TABLET | Freq: Two times a day (BID) | ORAL | 0 refills | Status: DC

## 2016-07-23 MED ORDER — DICYCLOMINE HCL 20 MG PO TABS: 20.0000 mg | ORAL_TABLET | Freq: Two times a day (BID) | ORAL | 0 refills | Status: DC

## 2016-07-23 MED ORDER — LORAZEPAM 2 MG/ML IJ SOLN
1.0000 mg | Freq: Once | INTRAMUSCULAR | Status: AC
  Administered 2016-07-23: 1 mg via INTRAVENOUS
  Filled 2016-07-23: qty 1

## 2016-07-23 MED ORDER — POTASSIUM CHLORIDE CRYS ER 20 MEQ PO TBCR
40.0000 meq | EXTENDED_RELEASE_TABLET | Freq: Once | ORAL | Status: AC
  Administered 2016-07-23: 40 meq via ORAL
  Filled 2016-07-23: qty 2

## 2016-07-23 MED ORDER — CHLORDIAZEPOXIDE HCL 25 MG PO CAPS: ORAL_CAPSULE | ORAL | 0 refills | Status: DC

## 2016-07-23 MED ORDER — ONDANSETRON 4 MG PO TBDP: 4.0000 mg | ORAL_TABLET | Freq: Three times a day (TID) | ORAL | 0 refills | Status: DC | PRN

## 2016-07-23 MED ORDER — SODIUM CHLORIDE 0.9 % IV BOLUS (SEPSIS)
1000.0000 mL | Freq: Once | INTRAVENOUS | Status: AC
  Administered 2016-07-23: 1000 mL via INTRAVENOUS

## 2016-07-23 NOTE — Discharge Instructions (Signed)
Take the medications as prescribed. Follow up with your PCP today. Return to the emergency department if you experience seizures, fevers, chest pain, shortness of breath, uncontrolled vomiting, uncontrolled shaking or any other concerning symptoms.

## 2017-06-25 ENCOUNTER — Encounter: Payer: Self-pay | Admitting: *Deleted

## 2017-06-25 ENCOUNTER — Emergency Department
Admission: EM | Admit: 2017-06-25 | Discharge: 2017-06-25 | Discharge status: Absent without leave | Payer: Self-pay | Attending: Emergency Medicine | Admitting: Emergency Medicine

## 2017-06-25 ENCOUNTER — Emergency Department: Payer: Self-pay

## 2017-06-25 ENCOUNTER — Emergency Department
Admission: EM | Admit: 2017-06-25 | Discharge: 2017-06-26 | Disposition: A | Discharge status: Home / Self Care | Payer: Self-pay | Attending: Emergency Medicine | Admitting: Emergency Medicine

## 2017-06-25 DIAGNOSIS — F17210 Nicotine dependence, cigarettes, uncomplicated: Secondary | ICD-10-CM | POA: Insufficient documentation

## 2017-06-25 DIAGNOSIS — T401X1A Poisoning by heroin, accidental (unintentional), initial encounter: Secondary | ICD-10-CM | POA: Insufficient documentation

## 2017-06-25 DIAGNOSIS — F1120 Opioid dependence, uncomplicated: Secondary | ICD-10-CM

## 2017-06-25 DIAGNOSIS — F11229 Opioid dependence with intoxication, unspecified: Secondary | ICD-10-CM | POA: Insufficient documentation

## 2017-06-25 LAB — COMPREHENSIVE METABOLIC PANEL
ALT: 114 U/L — AB (ref 14–54)
AST: 69 U/L — ABNORMAL HIGH (ref 15–41)
Albumin: 3.6 g/dL (ref 3.5–5.0)
Alkaline Phosphatase: 108 U/L (ref 38–126)
Anion gap: 9 (ref 5–15)
BILIRUBIN TOTAL: 1 mg/dL (ref 0.3–1.2)
BUN: 8 mg/dL (ref 6–20)
CO2: 25 mmol/L (ref 22–32)
Calcium: 8.7 mg/dL — ABNORMAL LOW (ref 8.9–10.3)
Chloride: 103 mmol/L (ref 101–111)
Creatinine, Ser: 0.74 mg/dL (ref 0.44–1.00)
GFR calc non Af Amer: >60 mL/min (ref >60)
Glucose, Bld: 138 mg/dL — ABNORMAL HIGH (ref 65–99)
Potassium: 3.2 mmol/L — ABNORMAL LOW (ref 3.5–5.1)
SODIUM: 137 mmol/L (ref 135–145)
TOTAL PROTEIN: 7.4 g/dL (ref 6.5–8.1)

## 2017-06-25 LAB — CBC
HEMATOCRIT: 37.5 % (ref 35.0–47.0)
HEMOGLOBIN: 12.6 g/dL (ref 12.0–16.0)
MCH: 30.2 pg (ref 26.0–34.0)
MCHC: 33.7 g/dL (ref 32.0–36.0)
MCV: 89.5 fL (ref 80.0–100.0)
Platelets: 271 10*3/uL (ref 150–440)
RBC: 4.19 MIL/uL (ref 3.80–5.20)
RDW: 14.2 % (ref 11.5–14.5)
WBC: 12.9 10*3/uL — ABNORMAL HIGH (ref 3.6–11.0)

## 2017-06-25 LAB — ETHANOL

## 2017-06-25 MED ORDER — SODIUM CHLORIDE 0.9 % IV BOLUS (SEPSIS)
1000.0000 mL | Freq: Once | INTRAVENOUS | Status: AC
  Administered 2017-06-25: 1000 mL via INTRAVENOUS

## 2017-06-25 NOTE — ED Triage Notes (Signed)
Pt presents via [employer] de[employer]. Pt is nude from waist down. Pt is sedated and reports that she has OD'D on heroin. Pt is poorly responsive to stimuli. Pt admits to heroin use today, denies other drugs and ETOH. Sheriff[facility]s dept accessed phone and called mother, divulging pt's presence in ED and condition upon arrival. Pt administered narcan 2 mg prior to arrival by EMS after which pt refused transport.

## 2017-06-25 NOTE — ED Provider Notes (Signed)
[facility] Emergency Department Provider Note  Time seen: 10:36 PM  I have reviewed the triage vital signs and the nursing notes.   HISTORY  Chief Complaint Drug Overdose    HPI Janice Simmons is a 36 y.o. female With a past medical history of anxiety, depression, history of IV drug abuse presents to the emergency department for unresponsiveness. According to police report there were called to the patient's residence for a cardiac arrest. The, patient unresponsive, drug paraphernalia around patient was given 2 rounds of IV Narcan and became responsive once again. Patient was able to answer questions but remained extremely somnolent. She was refusing transport and at that time EMS and police felt the patient would be a danger to herself as she could likely relapse into her drug-induced coma, so they placed the patient under an IVC and brought her to the emergency department.upon arrival patient is very somnolent but does awaken to voice. Will briefly answer questions before falling back asleep. When asked if she was trying to hurt herself she said "fuck no."  patient denies any other drug use tonight. Denies alcohol use tonight. Admits to IV heroin use.  Past Medical History:  Diagnosis Date  . Anxiety   . Depression   . Hepatitis   . IVDU (intravenous drug user)   . Polysubstance abuse     Patient Active Problem List   Diagnosis Date Noted  . Overdose 02/15/2016  . Acute encephalopathy   . Polysubstance abuse (HCC)   . Depression   . Anxiety   . Acute hypoxemic respiratory failure (HCC)   . Aspiration pneumonia of right middle lobe (HCC)   . Narcotic overdose (HCC)   . Sepsis (HCC) 04/13/2014  . Cellulitis 04/13/2014  . IV drug abuse (HCC) 04/13/2014  . Polysubstance (including opioids) dependence with physiological dependence (HCC) 11/12/2013    Past Surgical History:  Procedure Laterality Date  . TONSILLECTOMY      Prior to Admission medications    Medication Sig Start Date End Date Taking? Authorizing Provider  ALPRAZolam Prudy Feeler) 1 MG tablet Take 1 mg by mouth every 3 (three) hours.    [provider]  buprenorphine-naloxone (SUBOXONE) 8-2 MG SUBL SL tablet Place 1 tablet under the tongue 2 (two) times daily.    [provider]  chlordiazePOXIDE (LIBRIUM) 25 MG capsule  PO TID x 1D, then 25-50mg  PO BID X 1D, then 25-50mg  PO QD X 1D 07/23/16   Focht, Jessica L, PA  cloNIDine (CATAPRES) 0.2 MG tablet Take 1 tablet (0.2 mg total) by mouth 2 (two) times daily. 07/23/16   Focht, Joyce Copa, PA  dicyclomine (BENTYL) 20 MG tablet Take 1 tablet (20 mg total) by mouth 2 (two) times daily. 07/23/16   Focht, Joyce Copa, PA  gabapentin (NEURONTIN) 300 MG capsule Take 300 mg by mouth 3 (three) times daily.    [provider]  hydrOXYzine (ATARAX/VISTARIL) 25 MG tablet Take 25 mg by mouth every 6 (six) hours as needed for anxiety.    [provider]  ibuprofen (ADVIL,MOTRIN) 200 MG tablet Take 800 mg by mouth every 6 (six) hours as needed for moderate pain.    [provider]  lamoTRIgine (LAMICTAL) 100 MG tablet Take 100 mg by mouth daily.    [provider]  omeprazole (PRILOSEC) 20 MG capsule Take 20 mg by mouth daily.    [provider]  ondansetron (ZOFRAN ODT) 4 MG disintegrating tablet Take 1 tablet (4 mg total) by mouth  every 8 (eight) hours as needed for nausea or vomiting. 07/23/16   Focht, Joyce Copa, PA  QUEtiapine (SEROQUEL) 200 MG tablet Take 200 mg by mouth at bedtime.    [provider]    No Known Allergies  No family history on file.  Social History Social History  Substance Use Topics  . Smoking status: Current Every Day Smoker    Packs/day: 2.00    Types: Cigarettes  . Smokeless tobacco: Not on file  . Alcohol use Yes     Comment: pt reports binge drinking     Review of Systems Constitutional: Negative for fever. Cardiovascular: Negative for chest  pain. Respiratory: Negative for shortness of breath. Gastrointestinal: Negative for abdominal pain Musculoskeletal: Negative for back pain Neurological: Negative for headache All other ROS negative  ____________________________________________   PHYSICAL EXAM:  Constitutional: somnolent but does awaken to voice before falling back asleep. We'll briefly answer questions or follow commands. Eyes: Normal exam, 2 mm pupils ENT   Head: Normocephalic and atraumatic   Mouth/Throat: Mucous membranes are moist. Cardiovascular: Normal rate, regular rhythm. No murmur Respiratory: Normal respiratory effort without tachypnea nor retractions. Breath sounds are clear  Gastrointestinal: Soft and nontender. No distention.   Musculoskeletal: Nontender with normal range of motion in all extremities. Neurologic:  somewhat slurred speech. Moves all extremities. No gross deficits. Skin:  Skin is warm, dry and intact.  Psychiatric: Mood and affect are normal.  ____________________________________________    EKG  EKG reviewed and interpreted by myself shows normal sinus rhythm at 96 bpm, narrow QRS, normal axis, normal intervals, no concerning ST changes.  ____________________________________________    RADIOLOGY  IMPRESSION: Shallow inspiration with atelectasis in the lung bases. Mild cardiac enlargement. No focal consolidation.  ____________________________________________   INITIAL IMPRESSION / ASSESSMENT AND PLAN / ED COURSE  Pertinent labs & imaging results that were available during my care of the patient were reviewed by me and considered in my medical decision making (see chart for details).  patient presents to the emergency department for unresponsiveness. Differential this time would include heroin overdose, polysubstance abuse, intoxication, less likely head injury, altered mental status due to metabolic abnormality. We will check labs placed the patient on a cardiac  monitor and closely monitor in the emergency department. Patient remains quite somnolent but is able to awaken to voice and answer questions before falling back asleep. Patient denies any SI. At this time I do not believe the patient meets involuntary commitment criteria. We will cancel the IVC but continue to monitor closely medically. If her medical workup is normal and the patient is able to sober I believe she would be safe for discharge home at that time.  I reviewed the patient's records, significant history of polysubstance abuse with admissions to the hospital in the past including May of last year for unknown overdose.  Patient care signed out to oncoming physician.  ____________________________________________   FINAL CLINICAL IMPRESSION(S) / ED DIAGNOSES  heroin overdose    Minna Antis, MD 06/25/17 2309

## 2017-06-26 MED ORDER — NALOXONE HCL 4 MG/0.1ML NA LIQD: 1.0000 | Freq: Once | NASAL | Status: DC

## 2017-06-26 MED ORDER — ACETAMINOPHEN 500 MG PO TABS
ORAL_TABLET | ORAL | Status: AC
  Administered 2017-06-26: 1000 mg
  Filled 2017-06-26: qty 2

## 2017-06-26 NOTE — ED Provider Notes (Signed)
Patient received in sign-out from Dr. Fredrich Romans.  Workup and evaluation pending further observation until clinically sober. The patient now requesting something to eat and drink. Remains hemodynamic stable. Appears to metabolize off the remainder of her intoxication. Stable for discharge home.Willy Eddy, MD 06/26/17 [id number]

## 2017-06-26 NOTE — ED Notes (Signed)
Janice Simmons: sister in law [phone]

## 2017-08-07 IMAGING — CR DG CHEST 1V PORT
1 series · 1 of 1 positions shown · non-contrast
Comparison: None.

CLINICAL DATA: Cough and vomiting.

EXAM:
PORTABLE CHEST 1 VIEW

[AP]
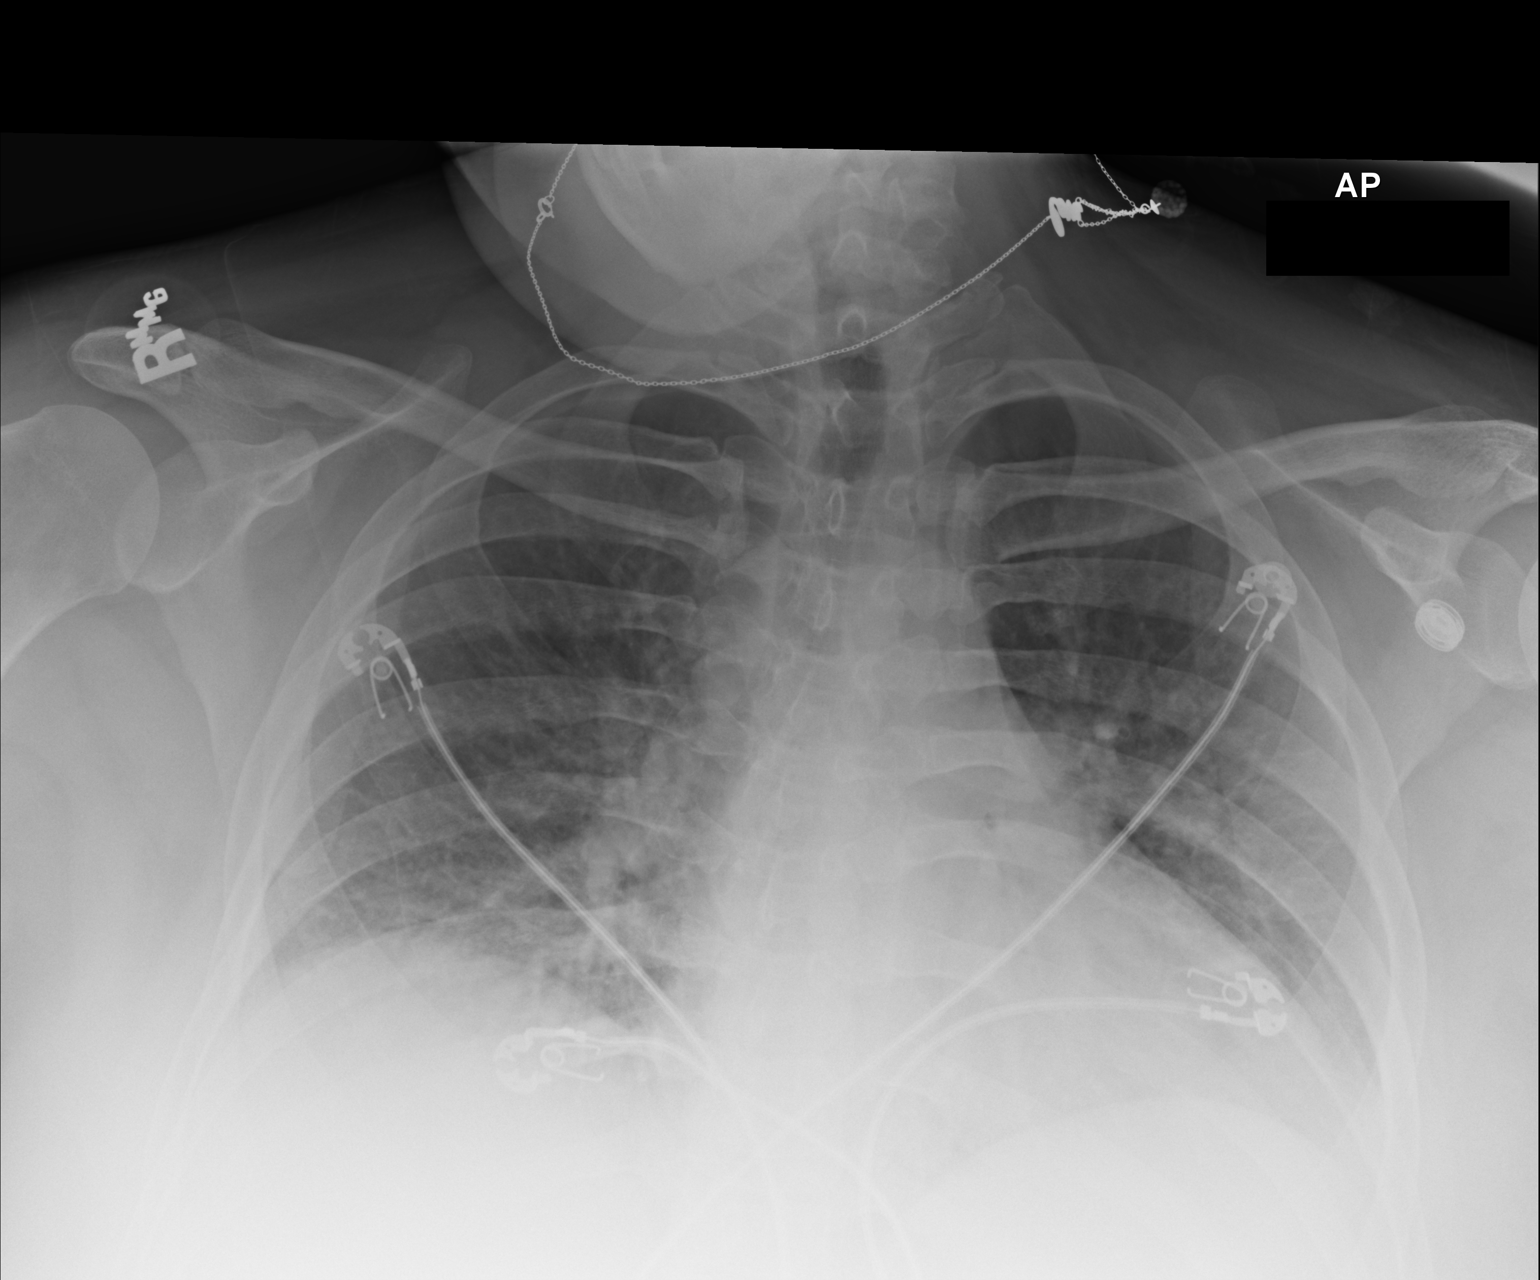

[1 of 1 positions shown; findings below may reference images not displayed]

FINDINGS: Lung volumes are low. Patchy opacity at the right lung base. Minimal
retrocardiac atelectasis at the left lung base. Heart size is normal
for technique. No pulmonary edema. No large pleural effusion. No
pneumothorax. Osseous structures grossly intact.
IMPRESSION: Patchy right basilar opacity. Retrocardiac left lung base
atelectasis. Findings can be seen with aspiration.

## 2017-08-10 IMAGING — CR DG CHEST 1V PORT
1 series · 1 of 1 positions shown · non-contrast
Comparison: 02/16/2016.

CLINICAL DATA: Shortness of breath

EXAM:
PORTABLE CHEST 1 VIEW

[AP]
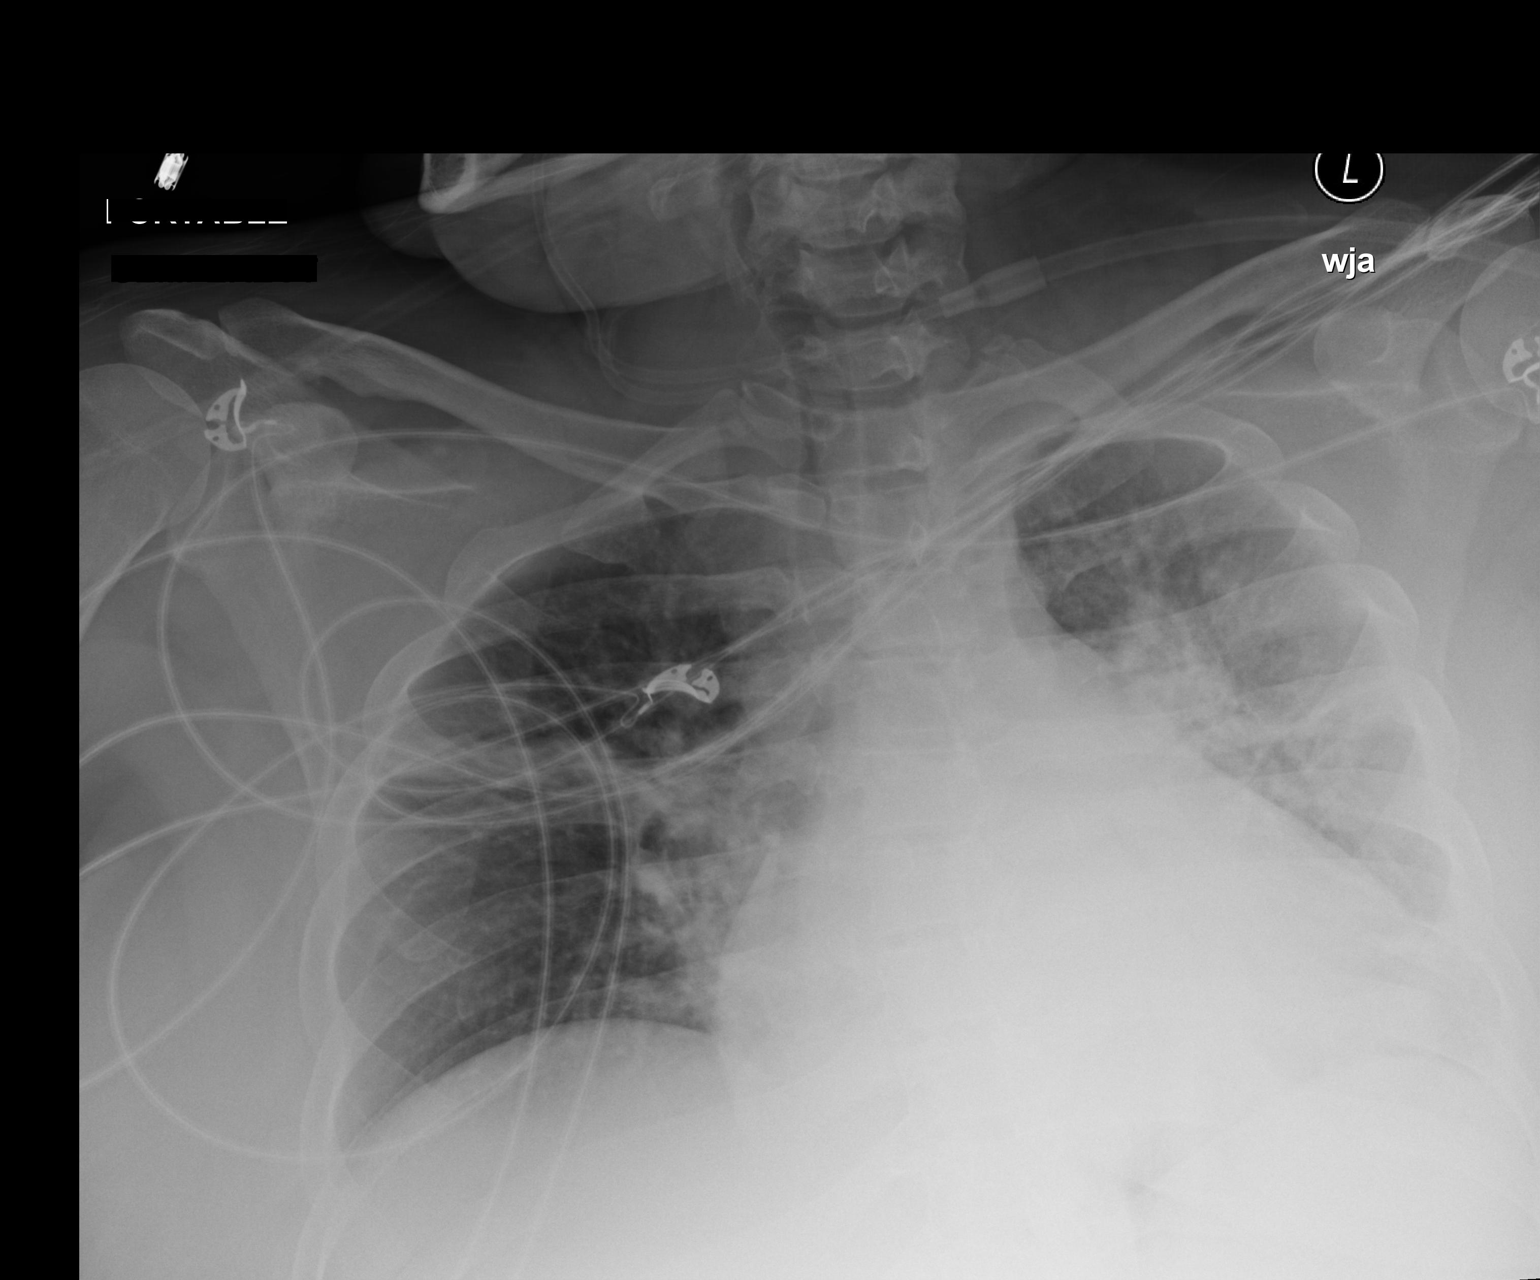

[1 of 1 positions shown; findings below may reference images not displayed]

FINDINGS: Cardiomegaly with pulmonary vascular congestion. Persistent but
improving bilateral pulmonary infiltrates. Atelectatic changes noted
both mid lungs. Tiny bilateral pleural effusions cannot be excluded.
No pneumothorax .
IMPRESSION: 1. Cardiomegaly with pulmonary vascular congestion.
2. Persistent but improving bilateral pulmonary infiltrates.
Atelectatic changes noted both mid lungs. Small bilateral pleural
effusions cannot be excluded.

## 2020-03-27 ENCOUNTER — Other Ambulatory Visit: Payer: Self-pay

## 2020-03-27 ENCOUNTER — Encounter (HOSPITAL_COMMUNITY): Payer: Self-pay | Admitting: Emergency Medicine

## 2020-03-27 ENCOUNTER — Emergency Department (HOSPITAL_COMMUNITY)
Admission: EM | Admit: 2020-03-27 | Discharge: 2020-03-28 | Disposition: A | Discharge status: Home / Self Care | Payer: Self-pay | Attending: Emergency Medicine | Admitting: Emergency Medicine

## 2020-03-27 DIAGNOSIS — F1110 Opioid abuse, uncomplicated: Secondary | ICD-10-CM | POA: Insufficient documentation

## 2020-03-27 DIAGNOSIS — F17210 Nicotine dependence, cigarettes, uncomplicated: Secondary | ICD-10-CM | POA: Insufficient documentation

## 2020-03-27 DIAGNOSIS — T424X2A Poisoning by benzodiazepines, intentional self-harm, initial encounter: Secondary | ICD-10-CM | POA: Insufficient documentation

## 2020-03-27 DIAGNOSIS — F1410 Cocaine abuse, uncomplicated: Secondary | ICD-10-CM | POA: Insufficient documentation

## 2020-03-27 DIAGNOSIS — F1910 Other psychoactive substance abuse, uncomplicated: Secondary | ICD-10-CM

## 2020-03-27 NOTE — ED Triage Notes (Signed)
Pt arrived via EMS. Pt was found laying on someone's car on the side of the road. Pt states that she has had some xanax, unsure of how much. Pt responds to verbal stimuli and can answer questions.

## 2020-03-28 LAB — CBC WITH DIFFERENTIAL/PLATELET
Abs Immature Granulocytes: 0.02 10*3/uL (ref 0.00–0.07)
Basophils Absolute: 0 10*3/uL (ref 0.0–0.1)
Basophils Relative: 0 %
Eosinophils Absolute: 0.1 10*3/uL (ref 0.0–0.5)
Eosinophils Relative: 2 %
HCT: 41 % (ref 36.0–46.0)
Hemoglobin: 13.4 g/dL (ref 12.0–15.0)
Immature Granulocytes: 0 %
Lymphocytes Relative: 35 %
Lymphs Abs: 2.7 10*3/uL (ref 0.7–4.0)
MCH: 30 pg (ref 26.0–34.0)
MCHC: 32.7 g/dL (ref 30.0–36.0)
MCV: 91.9 fL (ref 80.0–100.0)
Monocytes Absolute: 0.7 10*3/uL (ref 0.1–1.0)
Monocytes Relative: 9 %
Neutro Abs: 4.2 10*3/uL (ref 1.7–7.7)
Neutrophils Relative %: 54 %
Platelets: 183 10*3/uL (ref 150–400)
RBC: 4.46 MIL/uL (ref 3.87–5.11)
RDW: 14.3 % (ref 11.5–15.5)
WBC: 7.9 10*3/uL (ref 4.0–10.5)
nRBC: 0 % (ref 0.0–0.2)

## 2020-03-28 LAB — COMPREHENSIVE METABOLIC PANEL
ALT: 29 U/L (ref 0–44)
AST: 47 U/L — ABNORMAL HIGH (ref 15–41)
Albumin: 3.6 g/dL (ref 3.5–5.0)
Alkaline Phosphatase: 165 U/L — ABNORMAL HIGH (ref 38–126)
Anion gap: 8 (ref 5–15)
BUN: 10 mg/dL (ref 6–20)
CO2: 28 mmol/L (ref 22–32)
Calcium: 8.9 mg/dL (ref 8.9–10.3)
Chloride: 101 mmol/L (ref 98–111)
Creatinine, Ser: 0.75 mg/dL (ref 0.44–1.00)
GFR calc Af Amer: >60 mL/min (ref >60)
GFR calc non Af Amer: >60 mL/min (ref >60)
Glucose, Bld: 78 mg/dL (ref 70–99)
Potassium: 4.2 mmol/L (ref 3.5–5.1)
Sodium: 137 mmol/L (ref 135–145)
Total Bilirubin: 1.8 mg/dL — ABNORMAL HIGH (ref 0.3–1.2)
Total Protein: 6.9 g/dL (ref 6.5–8.1)

## 2020-03-28 LAB — ETHANOL: Alcohol, Ethyl (B): <10 mg/dL (ref <10)

## 2020-03-28 LAB — HCG, QUANTITATIVE, PREGNANCY: hCG, Beta Chain, Quant, S: <1 m[IU]/mL (ref <5)

## 2020-03-28 LAB — SALICYLATE LEVEL: Salicylate Lvl: <7 mg/dL — ABNORMAL LOW (ref 7.0–30.0)

## 2020-03-28 LAB — RAPID URINE DRUG SCREEN, HOSP PERFORMED
Amphetamines: POSITIVE — AB
Barbiturates: NOT DETECTED
Benzodiazepines: POSITIVE — AB
Cocaine: POSITIVE — AB
Opiates: NOT DETECTED
Tetrahydrocannabinol: NOT DETECTED

## 2020-03-28 LAB — ACETAMINOPHEN LEVEL: Acetaminophen (Tylenol), Serum: <10 ug/mL — ABNORMAL LOW (ref 10–30)

## 2020-03-28 MED ORDER — ONDANSETRON HCL 4 MG/2ML IJ SOLN
4.0000 mg | Freq: Once | INTRAMUSCULAR | Status: AC
  Administered 2020-03-28: 4 mg via INTRAVENOUS
  Filled 2020-03-28: qty 2

## 2020-03-28 MED ORDER — SODIUM CHLORIDE 0.9 % IV BOLUS
1000.0000 mL | Freq: Once | INTRAVENOUS | Status: AC
  Administered 2020-03-28: 1000 mL via INTRAVENOUS

## 2020-03-28 NOTE — ED Notes (Signed)
Pt does not have a ride. Called safe transport.

## 2020-03-28 NOTE — Discharge Instructions (Addendum)
Refrain from using illicit drugs

## 2020-03-28 NOTE — Patient Outreach (Signed)
ED Peer Support Specialist Patient Intake (Complete at intake & 30-60 Day Follow-up)  Name: Janice Simmons  MRN: [id number]  Age: 39 y.o.   Date of Admission: 03/28/2020  Intake: Initial Comments:      Primary Reason Admitted: Drug Overdose   Lab values: Alcohol/ETOH: Negative Positive UDS? Yes Amphetamines: Yes Barbiturates: No Benzodiazepines: Yes Cocaine: Yes Opiates: No Cannabinoids: No  Demographic information: Gender: Female Ethnicity: White Marital Status: Single Insurance Status: Uninsured/Self-pay Ecologist (Work Neurosurgeon, Physicist, medical, etc.: No Lives with: Partner/Spouse Living situation: House/Apartment  Reported Patient History: Patient reported health conditions: Depression, Anxiety disorders Patient aware of HIV and hepatitis status: No  In past year, has patient visited ED for any reason? No  Number of ED visits:    Reason(s) for visit:    In past year, has patient been hospitalized for any reason? No  Number of hospitalizations:    Reason(s) for hospitalization:    In past year, has patient been arrested? No  Number of arrests:    Reason(s) for arrest:    In past year, has patient been incarcerated? No  Number of incarcerations:    Reason(s) for incarceration:    In past year, has patient received medication-assisted treatment? No  In past year, patient received the following treatments: Other (comment)  In past year, has patient received any harm reduction services? No  Did this include any of the following?    In past year, has patient received care from a mental health provider for diagnosis other than SUD? No  In past year, is this first time patient has overdosed? No  Number of past overdoses:    In past year, is this first time patient has been hospitalized for an overdose? No  Number of hospitalizations for overdose(s):    Is patient currently receiving treatment for a mental health  diagnosis? No  Patient reports experiencing difficulty participating in SUD treatment: No    Most important reason(s) for this difficulty?    Has patient received prior services for treatment?  [facility] services)  In past, patient has received services from following agencies:    Plan of Care:  Suggested follow up at these agencies/treatment centers: Other (comment)  Other information:  [facility] met with Pt an was able to gain information to better assist Pt.[facility] was informed that Pt is already receiving treatment at the facility that [facility] was going to assist Pt with getting into. [facility] processed with Pt about continuing to use [facility] services.     Aaron Edelman Lacrystal Barbe, CPSS  03/28/2020 3:06 PM

## 2020-03-28 NOTE — ED Notes (Addendum)
Spoke with pt, and she said we can speak with her mother and give her an update on her medical condition. Called [facility], [phone], told her pt is stable and has been sleeping since I arrived at 7AM.

## 2020-03-28 NOTE — ED Provider Notes (Signed)
Janice Simmons is a 39 y.o. female, presenting to the ED with heroin use.  HPI from Janice Sites, PA-C: "39 year old female with history of anxiety, depression, hepatitis, IV drug use, polysubstance abuse, presenting to the ED via EMS after she was found sleeping on top of someone's car downtown.  She admitted to using Xanax with EMS.  No other history is known at this time.  Patient is somnolent but arousable on exam."  Past Medical History:  Diagnosis Date  . Anxiety   . Depression   . Hepatitis   . IVDU (intravenous drug user)   . Polysubstance abuse (HCC)     Physical Exam  BP 128/80   Pulse 72   Temp 97.6 F (36.4 C) (Oral)   Resp 14   Ht 5\' 11"  (1.803 m)   Wt 125 kg   SpO2 100%   BMI 38.43 kg/m   Physical Exam Vitals and nursing note reviewed.  Constitutional:      General: She is not in acute distress.    Appearance: She is well-developed. She is not diaphoretic.  HENT:     Head: Normocephalic and atraumatic.     Mouth/Throat:     Mouth: Mucous membranes are moist.     Pharynx: Oropharynx is clear.  Eyes:     Conjunctiva/sclera: Conjunctivae normal.  Cardiovascular:     Rate and Rhythm: Normal rate and regular rhythm.     Pulses: Normal pulses.          Radial pulses are 2+ on the right side and 2+ on the left side.       Posterior tibial pulses are 2+ on the right side and 2+ on the left side.     Heart sounds: Normal heart sounds.     Comments: Tactile temperature in the extremities appropriate and equal bilaterally. Pulmonary:     Effort: Pulmonary effort is normal. No respiratory distress.     Breath sounds: Normal breath sounds.  Abdominal:     Palpations: Abdomen is soft.     Tenderness: There is no abdominal tenderness. There is no guarding.  Musculoskeletal:     Cervical back: Neck supple.     Right lower leg: No edema.     Left lower leg: No edema.     Comments: Overall trauma exam performed without any abnormalities noted.  Lymphadenopathy:      Cervical: No cervical adenopathy.  Skin:    General: Skin is warm and dry.  Neurological:     Mental Status: She is alert.     Comments: Somnolent initially, however, patient's mental status improved throughout ED course. Sensation grossly intact to light touch in the extremities.   Grip strengths equal bilaterally.   Strength 5/5 in all extremities.  Coordination intact with finger-to-nose testing.  Cranial nerves III-XII grossly intact.  Handles oral secretions without noted difficulty.  No noted phonation or speech deficit. No facial droop.   Psychiatric:        Mood and Affect: Mood and affect normal.        Speech: Speech normal.        Behavior: Behavior normal.     ED Course/Procedures     Procedures   Abnormal Labs Reviewed  RAPID URINE DRUG SCREEN, HOSP PERFORMED - Abnormal; Notable for the following components:      Result Value   Cocaine POSITIVE (*)    Benzodiazepines POSITIVE (*)    Amphetamines POSITIVE (*)    All other components within normal  limits  SALICYLATE LEVEL - Abnormal; Notable for the following components:   Salicylate Lvl <7.0 (*)    All other components within normal limits  ACETAMINOPHEN LEVEL - Abnormal; Notable for the following components:   Acetaminophen (Tylenol), Serum <10 (*)    All other components within normal limits  COMPREHENSIVE METABOLIC PANEL - Abnormal; Notable for the following components:   AST 47 (*)    Alkaline Phosphatase 165 (*)    Total Bilirubin 1.8 (*)    All other components within normal limits      MDM   Clinical Course as of Mar 28 1624  Mon Mar 28, 2020  0720 No abdominal pain or tenderness. Previously fluctuated between 1 and similar level.   Total Bilirubin(!): 1.8 [SJ]  0836 Patient continues to sleep.   [SJ]  [id number] Spoke with patient. Still somnolent.  However, patient does state she thinks she is due for her methadone dose.  She does not know what her dosage, however.  She also does not know when  she last had her methadone.   [SJ]  1200 Patient awake and talking.  States she ate some lunch, but now feels nauseous.   [SJ]    Clinical Course User Index [SJ] Janice Pancoast, PA-C   Patient care handoff report received from Janice Sites, PA-C. Plan: Reassess and discharge when clinically sober.  Patient presents after using heroin and taking Xanax.  She also endorses poor oral intake. Patient was rehydrated.  She tolerated oral intake here in the ED.  She ambulated to a bedside commode, but stated she felt wobbly.  After more IV fluids, she stated she felt better.  She was discharged, safe transport was secured, and there is another adult at the property with whom the patient has been contacted and assured he would be at the residence. Peers support consult was placed and made contact with the patient.  She denies SI/HI.  A considerable amount of time was spent by the pharmacy team to try and get information about patient's methadone, whether she continues to take methadone, and the dosage.  Due to the observed Fourth of July holiday today, we were unable to confirm this information.  She did not show signs of withdrawal upon discharge.  She states she has a dose of methadone at home.  Findings and plan of care discussed with Janice Razor, MD.   Vitals:   03/27/20 2346 03/28/20 0145 03/28/20 0302 03/28/20 0500  BP:  124/77 131/79 128/80  Pulse:  73 72 72  Resp:  13 14 14   Temp:      TempSrc:      SpO2:  100% 100% 100%  Weight: 125 kg     Height: 5\' 11"  (1.803 m)      Vitals:   03/28/20 1300 03/28/20 1330 03/28/20 1400 03/28/20 1430  BP: 122/72 127/80 123/75 121/69  Pulse: 83 82 80 79  Resp: 17 19 18 16   Temp:      TempSrc:      SpO2: 97% 98% 97% 97%  Weight:      Height:          05/29/20, PA-C 03/28/20 1625    , MD 03/29/20 [phone]

## 2020-03-28 NOTE — ED Notes (Signed)
Janice Simmons, pt mother, called wanting an update on pt. Her number is [phone].

## 2020-03-28 NOTE — ED Notes (Signed)
Ambulated pt to beside commode and back to bed. She was dizzy and unsteady while standing.

## 2020-03-28 NOTE — ED Notes (Signed)
Pt provided with meal tray and setup

## 2020-03-28 NOTE — ED Provider Notes (Signed)
[facility]-EMERGENCY DEPT Provider Note   CSN: [id number] Arrival date & time: 03/27/20  2325     History Chief Complaint  Patient presents with  . Drug Overdose    Janice Simmons is a 39 y.o. female.  The history is provided by the patient and medical records.  Drug Overdose   Level 5 caveat: Overdose  39 year old female with history of anxiety, depression, hepatitis, IV drug use, polysubstance abuse, presenting to the ED via EMS after she was found sleeping on top of someone's car downtown.  She admitted to using Xanax with EMS.  No other history is known at this time.  Patient is somnolent but arousable on exam.  Past Medical History:  Diagnosis Date  . Anxiety   . Depression   . Hepatitis   . IVDU (intravenous drug user)   . Polysubstance abuse [facility])     Patient Active Problem List   Diagnosis Date Noted  . Overdose 02/15/2016  . Acute encephalopathy   . Polysubstance abuse (HCC)   . Depression   . Anxiety   . Acute hypoxemic respiratory failure (HCC)   . Aspiration pneumonia of right middle lobe (HCC)   . Narcotic overdose (HCC)   . Sepsis (HCC) 04/13/2014  . Cellulitis 04/13/2014  . IV drug abuse (HCC) 04/13/2014  . Polysubstance (including opioids) dependence with physiological dependence (HCC) 11/12/2013    Past Surgical History:  Procedure Laterality Date  . BACK SURGERY     x 4 months ag  . TONSILLECTOMY       OB History   No obstetric history on file.     History reviewed. No pertinent family history.  Social History   Tobacco Use  . Smoking status: Current Every Day Smoker    Packs/day: 2.00    Types: Cigarettes  . Smokeless tobacco: Never Used  Vaping Use  . Vaping Use: Unknown  Substance Use Topics  . Alcohol use: Yes    Comment: pt reports binge drinking   . Drug use: Yes    Types: Cocaine, IV, Benzodiazepines, Heroin, Morphine    Comment: opana, morphine, heroine, dilaudid    Home Medications Prior to  Admission medications   Medication Sig Start Date End Date Taking? Authorizing Provider  ALPRAZolam Prudy Feeler) 1 MG tablet Take 1 mg by mouth every 3 (three) hours.    [provider]  buprenorphine-naloxone (SUBOXONE) 8-2 MG SUBL SL tablet Place 1 tablet under the tongue 2 (two) times daily.    [provider]  chlordiazePOXIDE (LIBRIUM) 25 MG capsule 50mg  PO TID x 1D, then 25-50mg  PO BID X 1D, then 25-50mg  PO QD X 1D 07/23/16   Focht, Jessica L, PA  cloNIDine (CATAPRES) 0.2 MG tablet Take 1 tablet (0.2 mg total) by mouth 2 (two) times daily. 07/23/16   Focht, 07/25/16, PA  dicyclomine (BENTYL) 20 MG tablet Take 1 tablet (20 mg total) by mouth 2 (two) times daily. 07/23/16   Focht, 07/25/16, PA  gabapentin (NEURONTIN) 300 MG capsule Take 300 mg by mouth 3 (three) times daily.    [provider]  hydrOXYzine (ATARAX/VISTARIL) 25 MG tablet Take 25 mg by mouth every 6 (six) hours as needed for anxiety.    [provider]  ibuprofen (ADVIL,MOTRIN) 200 MG tablet Take 800 mg by mouth every 6 (six) hours as needed for moderate pain.    [provider]  lamoTRIgine (LAMICTAL) 100 MG tablet Take 100 mg by mouth daily.    [provider]  omeprazole (PRILOSEC) 20 MG capsule Take 20 mg by mouth daily.    [provider]  ondansetron (ZOFRAN ODT) 4 MG disintegrating tablet Take 1 tablet (4 mg total) by mouth every 8 (eight) hours as needed for nausea or vomiting. 07/23/16   Focht, Joyce Copa, PA  QUEtiapine (SEROQUEL) 200 MG tablet Take 200 mg by mouth at bedtime.    [provider]    Allergies    Patient has no known allergies.  Review of Systems   Review of Systems  Unable to perform ROS: Other    Physical Exam Updated Vital Signs BP 104/61   Pulse 70   Temp 97.6 F (36.4 C) (Oral)   Resp 16   Ht 5\' 11"  (1.803 m)   Wt 125 kg   SpO2 100%   BMI 38.43 kg/m   Physical Exam Vitals and nursing note reviewed.    Constitutional:      Appearance: She is well-developed.     Comments: Somnolent, opens eyes to loud voice Disheveled appearing  HENT:     Head: Normocephalic and atraumatic.  Eyes:     Conjunctiva/sclera: Conjunctivae normal.     Pupils: Pupils are equal, round, and reactive to light.  Cardiovascular:     Rate and Rhythm: Normal rate and regular rhythm.     Heart sounds: Normal heart sounds.  Pulmonary:     Effort: Pulmonary effort is normal. No respiratory distress.     Breath sounds: Normal breath sounds. No rhonchi.  Abdominal:     General: Bowel sounds are normal.     Palpations: Abdomen is soft.     Tenderness: There is no abdominal tenderness. There is no rebound.  Musculoskeletal:        General: Normal range of motion.     Cervical back: Normal range of motion.  Feet:     Comments: Feet are dirty, not wearing shoes Skin:    General: Skin is warm and dry.  Neurological:     Mental Status: She is oriented to person, place, and time.     Sensory: No sensory deficit.     Motor: No weakness.     ED Results / Procedures / Treatments   Labs (all labs ordered are listed, but only abnormal results are displayed) Labs Reviewed  RAPID URINE DRUG SCREEN, HOSP PERFORMED - Abnormal; Notable for the following components:      Result Value   Cocaine POSITIVE (*)    Benzodiazepines POSITIVE (*)    Amphetamines POSITIVE (*)    All other components within normal limits  SALICYLATE LEVEL - Abnormal; Notable for the following components:   Salicylate Lvl <7.0 (*)    All other components within normal limits  ACETAMINOPHEN LEVEL - Abnormal; Notable for the following components:   Acetaminophen (Tylenol), Serum <10 (*)    All other components within normal limits  COMPREHENSIVE METABOLIC PANEL - Abnormal; Notable for the following components:   AST 47 (*)    Alkaline Phosphatase 165 (*)    Total Bilirubin 1.8 (*)    All other components within normal limits  CBC WITH  DIFFERENTIAL/PLATELET  ETHANOL  HCG, QUANTITATIVE, PREGNANCY    EKG None  Radiology No results found.  Procedures Procedures (including critical care time)  Medications Ordered in ED Medications - No data to display  ED Course  I have reviewed the triage vital signs and the nursing notes.  Pertinent labs & imaging results that were available during my  care of the patient were reviewed by me and considered in my medical decision making (see chart for details).    MDM Rules/Calculators/A&P  39 year old female presenting to the ED via EMS after being found sleeping on top of someone's car.  She reported using xanax.  On arrival, patient somnolent but opens eyes to voice.  Vitals are stable.  No further history known at this time.  Exam is atraumatic.  Will obtain screening labs, ethanol, UDS.  EKG NSR without acute ischemic changes.  Labs as above-- no significant findings.  UDS pending, patient has yet to urinate here.  She continues sleeping soundly.  5:19 AM Patient reassessed--- she is more awake/alert than prior, able to answer some questions but falling back asleep very quickly.  VSS.  She confirms she was using xanax but denies other substance abuse.  She was unable to use pure-wick in room so I have helped RN place her on bedpan, she falls asleep almost immediately after.  I do not think she is awake enough to ambulate at this time. Will continue to monitor.   6:28 AM Patient still falling asleep several times during conversation.  She will need to be observed further until able to remain awake and ambulate.    Care will be signed out to oncoming provider.  Anticipate discharge, paperwork has been prepared for her with OP resources for treatment centers and local shelters.  Final Clinical Impression(s) / ED Diagnoses Final diagnoses:  Polysubstance abuse [facility])    Rx / DC Orders ED Discharge Orders    None       Garlon Hatchet, PA-C 03/28/20 1194    Ward,  Layla Maw, DO 03/28/20 [phone]
